# Patient Record
Sex: Male | Born: 1980 | Race: White | Hispanic: No | Marital: Single | Smoking: Current every day smoker
Health system: Southern US, Community
[De-identification: ages and names within clinical notes are randomized; demographics above are authoritative.]

## PROBLEM LIST (undated history)

## (undated) HISTORY — PX: TONSILLECTOMY: SUR1361

## (undated) HISTORY — PX: OTHER SURGICAL HISTORY: SHX169

---

## 2012-11-12 ENCOUNTER — Encounter (HOSPITAL_COMMUNITY): Payer: Self-pay | Admitting: Emergency Medicine

## 2012-11-12 ENCOUNTER — Emergency Department (INDEPENDENT_AMBULATORY_CARE_PROVIDER_SITE_OTHER)
Admission: EM | Admit: 2012-11-12 | Discharge: 2012-11-12 | Disposition: A | Discharge status: Home / Self Care | Payer: Self-pay | Source: Home / Self Care

## 2012-11-12 ENCOUNTER — Other Ambulatory Visit (HOSPITAL_COMMUNITY)
Admission: RE | Admit: 2012-11-12 | Discharge: 2012-11-12 | Disposition: A | Discharge status: Home / Self Care | Payer: Self-pay | Source: Ambulatory Visit | Attending: Family Medicine | Admitting: Family Medicine

## 2012-11-12 DIAGNOSIS — Z113 Encounter for screening for infections with a predominantly sexual mode of transmission: Secondary | ICD-10-CM | POA: Insufficient documentation

## 2012-11-12 DIAGNOSIS — R361 Hematospermia: Secondary | ICD-10-CM

## 2012-11-12 DIAGNOSIS — R319 Hematuria, unspecified: Secondary | ICD-10-CM

## 2012-11-12 LAB — POCT URINALYSIS DIP (DEVICE)
Bilirubin Urine: NEGATIVE
Hgb urine dipstick: NEGATIVE
Ketones, ur: NEGATIVE mg/dL
Protein, ur: NEGATIVE mg/dL
Specific Gravity, Urine: 1.015 (ref 1.005–1.030)
pH: 7 (ref 5.0–8.0)

## 2012-11-12 MED ORDER — DOXYCYCLINE HYCLATE 100 MG PO CAPS: 100.0000 mg | ORAL_CAPSULE | Freq: Two times a day (BID) | ORAL | Status: AC

## 2012-11-12 NOTE — ED Provider Notes (Signed)
CSN: [id number]     Arrival date & time 11/12/12  1117 History   First MD Initiated Contact with Patient 11/12/12 1258     Chief Complaint  Patient presents with  . Hematuria  . hemotosperm    (Consider location/radiation/quality/duration/timing/severity/associated sxs/prior Treatment) HPI Comments: 32 year old male states that he noticed blood in the urine when medics rating after intercourse. The next day after having sexual intercourse in his there was blood in the semen. This was 4 days ago. He denies frank dysuria he states there is an unusual pressure with urination. 3 days ago he had urinary urgency but this was self-limited and has not occurred since. He denies any recent prostate or other urologic procedures.   History reviewed. No pertinent past medical history. No past surgical history on file. No family history on file. History  Substance Use Topics  . Smoking status: Not on file  . Smokeless tobacco: Not on file  . Alcohol Use: Not on file    Review of Systems  Constitutional: Negative.   Respiratory: Negative.   Gastrointestinal: Negative.   Genitourinary: Negative for dysuria, urgency, frequency, flank pain, decreased urine volume, discharge, penile swelling, scrotal swelling, difficulty urinating, penile pain and testicular pain.  Neurological: Negative.   Hematological: Negative for adenopathy.    Allergies  Codeine  Home Medications   Current Outpatient Rx  Name  Route  Sig  Dispense  Refill  . doxycycline (VIBRAMYCIN) 100 MG capsule   Oral   Take 1 capsule (100 mg total) by mouth 2 (two) times daily.   20 capsule   0    BP 118/78  Pulse 68  Temp(Src) 98.3 F (36.8 C) (Oral)  Resp 16  SpO2 100% Physical Exam  Nursing note and vitals reviewed. Constitutional: He is oriented to person, place, and time. He appears well-developed and well-nourished. No distress.  Eyes: EOM are normal.  Neck: Normal range of motion. Neck supple.  Cardiovascular:  Normal rate.   Pulmonary/Chest: Effort normal. No respiratory distress.  Abdominal: Soft. He exhibits no distension.  Genitourinary:  Normal external male genitalia. Normal phallus. Palpation of the testicles reveal no tenderness. Vas deferens and epididymal apparatus are palpable and nontender. No nodules are palpated to the testicles. There is no swelling in the scrotum. No inguinal or suprapubic lymphadenopathy. No urethral drainage with manual pressure to the penis.  Musculoskeletal: He exhibits no edema and no tenderness.  Neurological: He is alert and oriented to person, place, and time. He exhibits normal muscle tone.  Skin: Skin is warm. No rash noted.  Psychiatric: He has a normal mood and affect.    ED Course  Procedures (including critical care ti she me and I an) Labs Review Labs Reviewed  POCT URINALYSIS DIP (DEVICE)  URINE CYTOLOGY ANCILLARY ONLY   Imaging Review No results found.  Results for orders placed during the hospital encounter of 11/12/12  POCT URINALYSIS DIP (DEVICE)      Result Value Range   Glucose, UA NEGATIVE  NEGATIVE mg/dL   Bilirubin Urine NEGATIVE  NEGATIVE   Ketones, ur NEGATIVE  NEGATIVE mg/dL   Specific Gravity, Urine 1.015  1.005 - 1.030   Hgb urine dipstick NEGATIVE  NEGATIVE   pH 7.0  5.0 - 8.0   Protein, ur NEGATIVE  NEGATIVE mg/dL   Urobilinogen, UA 0.2  0.0 - 1.0 mg/dL   Nitrite NEGATIVE  NEGATIVE   Leukocytes, UA NEGATIVE  NEGATIVE     MDM   1. Hematuria  2. Hematospermia    Doxy 100mg  BID x 10 d Urine ancillary test pending Reassurance and information on hematuria and hematospermia    Hayden Rasmussen, NP 11/12/12 1353

## 2012-11-12 NOTE — ED Notes (Signed)
C/o blood in urine and sperm which was noticed Thursday after patient has had sexual intercourse.   Patient says he only tried flushing his system.

## 2012-11-13 NOTE — ED Provider Notes (Signed)
Medical screening examination/treatment/procedure(s) were performed by a resident physician or non-physician practitioner and as the supervising physician I was immediately available for consultation/collaboration.  Clementeen Graham, MD    Rodolph Bong, MD 11/13/12 [phone]

## 2015-11-18 ENCOUNTER — Other Ambulatory Visit (HOSPITAL_COMMUNITY): Payer: Self-pay | Admitting: Sports Medicine

## 2015-11-18 DIAGNOSIS — M549 Dorsalgia, unspecified: Secondary | ICD-10-CM

## 2015-11-25 ENCOUNTER — Ambulatory Visit (HOSPITAL_COMMUNITY)
Admission: RE | Admit: 2015-11-25 | Discharge: 2015-11-25 | Disposition: A | Discharge status: Home / Self Care | Payer: Self-pay | Source: Ambulatory Visit | Attending: Sports Medicine | Admitting: Sports Medicine

## 2015-11-25 DIAGNOSIS — M5416 Radiculopathy, lumbar region: Secondary | ICD-10-CM | POA: Insufficient documentation

## 2015-11-25 DIAGNOSIS — M549 Dorsalgia, unspecified: Secondary | ICD-10-CM

## 2015-12-03 ENCOUNTER — Encounter: Payer: Self-pay | Admitting: Physical Therapy

## 2015-12-03 ENCOUNTER — Ambulatory Visit: Payer: Self-pay | Attending: Sports Medicine | Admitting: Physical Therapy

## 2015-12-03 DIAGNOSIS — M62830 Muscle spasm of back: Secondary | ICD-10-CM | POA: Insufficient documentation

## 2015-12-03 DIAGNOSIS — R262 Difficulty in walking, not elsewhere classified: Secondary | ICD-10-CM | POA: Insufficient documentation

## 2015-12-03 DIAGNOSIS — M5442 Lumbago with sciatica, left side: Secondary | ICD-10-CM | POA: Insufficient documentation
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
## 2015-12-08 ENCOUNTER — Ambulatory Visit: Payer: Self-pay | Admitting: Physical Therapy

## 2015-12-08 DIAGNOSIS — M62830 Muscle spasm of back: Secondary | ICD-10-CM

## 2015-12-08 DIAGNOSIS — M5442 Lumbago with sciatica, left side: Secondary | ICD-10-CM

## 2015-12-08 DIAGNOSIS — R262 Difficulty in walking, not elsewhere classified: Secondary | ICD-10-CM

## 2015-12-08 NOTE — Therapy (Signed)
[place], [place], [address] Phone: [phone]   Fax:  [phone]  Physical Therapy Treatment  Patient Details  Name: Derrick Herman MRN: [id number] Date of Birth: 29-Nov-1980 Referring Provider: Dr Berle Mull   Encounter Date: 12/08/2015      PT End of Session - 12/08/15 1156    Visit Number 2   Number of Visits 16   Date for PT Re-Evaluation 01/28/16   Authorization Type 3rd party insurance    PT Start Time 1016   PT Stop Time 1106   PT Time Calculation (min) 50 min   Activity Tolerance Patient tolerated treatment well   Behavior During Therapy [facility] for tasks assessed/performed      No past medical history on file.  No past surgical history on file.  There were no vitals filed for this visit.      Subjective Assessment - 12/08/15 1016    Subjective "Feeling about the same since evaluation, being consistent with stretching and not really noticing any relief at this point. Feeling more comfortable standing versus sitting"   Currently in Pain? Yes   Pain Score 7   took NSAID for pain this AM / excedrin for HA an hour ago.    Pain Location Back   Pain Orientation Right   Pain Descriptors / Indicators Aching;Shooting;Tightness;Sore   Pain Type Acute pain   Pain Onset More than a month ago   Pain Frequency Constant                         [facility] Adult PT Treatment/Exercise - 12/08/15 0001      Lumbar Exercises: Stretches   Active Hamstring Stretch 3 reps;30 seconds  contract/ relax with 10 sec contraction     Moist Heat Therapy   Number Minutes Moist Heat 10 Minutes   Moist Heat Location Lumbar Spine     Manual Therapy   Manual Therapy Muscle Energy Technique;Joint mobilization;Soft tissue mobilization;Myofascial release   Joint Mobilization L innominate anterior rotation grade 2-3   Soft tissue mobilization IASTM over the lumbar paraspinals   Myofascial Release  fascial stretching over L lumbar paraspinals   Muscle Energy Technique R sidelying: resisted L hip flexion with R anterior hip mob grade 2 oscillations, 6 x 10 sec hold                PT Education - 12/08/15 1155    Education provided Yes   Education Details updated HEP with proper form and rationale. anatmoy of the SIJ and effects of surrounding musculature   Person(s) Educated Patient   Methods Explanation;Verbal cues;Handout   Comprehension Verbalized understanding;Verbal cues required          PT Short Term Goals - 12/03/15 2049      PT SHORT TERM GOAL #1   Title Patient will demsotrate a good core contraction    Time 4   Period Weeks   Status New     PT SHORT TERM GOAL #2   Title Patient will increase lumbar flexion and extension by 25% without pain    Time 4   Period Weeks   Status New     PT SHORT TERM GOAL #3   Title Patient will improve gross left lower extremity strength to 4/5    Time 4   Period Weeks   Status New     PT SHORT TERM GOAL #4   Title Patient will report centralized  lower back pain with no left radiculopathy    Time 4   Period Weeks   Status New     PT SHORT TERM GOAL #5   Title Patient's PSIS level will be equal L V R    Time 4   Period Weeks   Status New           PT Long Term Goals - 12/03/15 2053      PT LONG TERM GOAL #1   Title Patient will ride in a car for 30 minutes wihout increased pain in order to perfrom daily tasks    Time 8   Period Weeks   Status New     PT LONG TERM GOAL #2   Title Patient will demsotrate full lumbar flexion in order to pick objects off the ground in order to put his shoes on without pain    Time 8   Period Weeks   Status New     PT LONG TERM GOAL #3   Title Patient will ambulate 2 miles without increased pain in order to return to hiking    Time 8   Period Weeks   Status New               Plan - 12/08/15 1156    Clinical Impression Statement Elan reports consistency  with HEP with minimal improvement in pain. performed L anterior innominate mobs which he reported decreased pain and reduced tingling in the foot. MET performed in R sidelying with improvement of mobility in standing/ walking. continued MHP post session to calm down muscle tightness.    PT Next Visit Plan DN,, continue with manual therapy to decrease spasming, continue with hip mobilization, Continue with pelvic correction, consider light core stability training. innominate mobs   PT Home Exercise Plan Pirifromis stretch with towel;;  single knee to chest, straight leg raise, hamstring stretching   Consulted and Agree with Plan of Care Patient      Patient will benefit from skilled therapeutic intervention in order to improve the following deficits and impairments:  Abnormal gait, Decreased activity tolerance, Decreased strength, Decreased mobility, Impaired flexibility, Increased muscle spasms, Increased fascial restricitons, Difficulty walking, Decreased range of motion, Pain, Postural dysfunction  Visit Diagnosis: Acute left-sided low back pain with left-sided sciatica  Muscle spasm of back  Difficulty in walking, not elsewhere classified     Problem List There are no active problems to display for this patient.  Starr Lake PT, DPT, LAT, ATC  12/08/15  12:08 PM      [facility] [address] [place], [place], [address] Phone: [phone]   Fax:  [phone]  Name: Derrick [facility] MRN: [id number] Date of Birth: [birth year]

## 2015-12-10 ENCOUNTER — Ambulatory Visit: Payer: Self-pay | Admitting: Physical Therapy

## 2015-12-10 DIAGNOSIS — M5442 Lumbago with sciatica, left side: Secondary | ICD-10-CM

## 2015-12-10 DIAGNOSIS — M62830 Muscle spasm of back: Secondary | ICD-10-CM

## 2015-12-10 DIAGNOSIS — R262 Difficulty in walking, not elsewhere classified: Secondary | ICD-10-CM

## 2015-12-10 NOTE — Therapy (Signed)
[place], [place], [address] Phone: [phone]   Fax:  [phone]  Physical Therapy Treatment  Patient Details  Name: Derrick Herman MRN: [id number] Date of Birth: [birth year] Referring Provider: Dr Berle Mull   Encounter Date: 12/10/2015      PT End of Session - 12/10/15 1516    Visit Number 3   Number of Visits 16   Date for PT Re-Evaluation 01/28/16   Authorization Type 3rd party insurance    PT Start Time 1148   PT Stop Time 1240   PT Time Calculation (min) 52 min   Activity Tolerance Patient tolerated treatment well   Behavior During Therapy [facility] for tasks assessed/performed      No past medical history on file.  No past surgical history on file.  There were no vitals filed for this visit.      Subjective Assessment - 12/10/15 1154    Subjective Patient reports it is a little better at times but over all he feels very stiff when he walks. He has done the stretches and exercises. He reports they are becoming easier to do.    Pertinent History Low back injury in 2005    Limitations Standing;Sitting;House hold activities   How long can you sit comfortably? < 10 min before he has to atart shifting    How long can you stand comfortably? hurts less then sitting    How long can you walk comfortably? about a 1/4 mile    Diagnostic tests MRI: (-)    Patient Stated Goals Less back pain    Currently in Pain? Yes   Pain Score 7    Pain Location Back   Pain Orientation Right   Pain Descriptors / Indicators Aching;Shooting;Tightness;Sore   Pain Type Acute pain   Pain Onset More than a month ago   Pain Frequency Constant   Aggravating Factors  lying flat, prolongged sitting, standing, walking    Pain Relieving Factors Stretches have helped some    Effect of Pain on Daily Activities difficulty perfroming ADL's/ IADL's    Multiple Pain Sites No                         [facility]  Adult PT Treatment/Exercise - 12/10/15 0001      Ambulation/Gait   Gait Comments Improved hip rotation but still limited.      Lumbar Exercises: Stretches   Active Hamstring Stretch 3 reps;30 seconds  contract/ relax with 10 sec contraction   Active Hamstring Stretch Limitations seated    Single Knee to Chest Stretch Limitations 3x15 second hold with towel    Piriformis Stretch Limitations with a towel to improve hip mobility 2x10 sec hold      Moist Heat Therapy   Number Minutes Moist Heat 10 Minutes   Moist Heat Location Lumbar Spine     Manual Therapy   Manual Therapy Muscle Energy Technique;Joint mobilization;Soft tissue mobilization;Myofascial release   Joint Mobilization L innominate anterior rotation grade 2-3   Soft tissue mobilization IASTM over the lumbar paraspinals   Myofascial Release fascial stretching over L lumbar paraspinals   Muscle Energy Technique R sidelying: resisted L hip flexion with R anterior hip mob grade 2 oscillations, 6 x 10 sec hold                PT Education - 12/10/15 1434    Education provided Yes   Education Details importance of plevic  correction; improtance of hamstring stretching, core exercises    Person(s) Educated Patient   Methods Explanation;Tactile cues;Handout   Comprehension Verbalized understanding;Verbal cues required;Returned demonstration          PT Short Term Goals - 12/10/15 1526      PT SHORT TERM GOAL #1   Title Patient will demsotrate a good core contraction    Time 4   Period Weeks   Status On-going     PT SHORT TERM GOAL #2   Title Patient will increase lumbar flexion and extension by 25% without pain    Time 4   Period Weeks   Status On-going     PT SHORT TERM GOAL #3   Title Patient will improve gross left lower extremity strength to 4/5    Time 4   Period Weeks   Status On-going     PT SHORT TERM GOAL #4   Title Patient will report centralized lower back pain with no left radiculopathy     Time 4   Period Weeks   Status On-going     PT SHORT TERM GOAL #5   Title Patient's PSIS level will be equal L V R    Period Weeks   Status On-going           PT Long Term Goals - 12/03/15 2053      PT LONG TERM GOAL #1   Title Patient will ride in a car for 30 minutes wihout increased pain in order to perfrom daily tasks    Time 8   Period Weeks   Status New     PT LONG TERM GOAL #2   Title Patient will demsotrate full lumbar flexion in order to pick objects off the ground in order to put his shoes on without pain    Time 8   Period Weeks   Status New     PT LONG TERM GOAL #3   Title Patient will ambulate 2 miles without increased pain in order to return to hiking    Time 8   Period Weeks   Status New               Plan - 12/10/15 1518    Clinical Impression Statement Patient continues to have pain with all activity but therapy was able to add in light core stregthening exercises. Therapy continued to work on right hip mobilization and MET in right side lying. He tolerated soft tissue work better as the session continued.    Rehab Potential Good   PT Frequency 2x / week   PT Duration 8 weeks   PT Treatment/Interventions ADLs/Self Care Home Management;Cryotherapy;Electrical Stimulation;Stair training;Gait training;Ultrasound;Moist Heat;Iontophoresis 49m/ml Dexamethasone;Dry needling;Passive range of motion;Therapeutic activities;Therapeutic exercise;Neuromuscular re-education;Taping;Splinting;Visual/perceptual remediation/compensation   PT Next Visit Plan DN,, continue with manual therapy to decrease spasming, continue with hip mobilization, Continue with pelvic correction, consider light core stability training. innominate mobs   PT Home Exercise Plan Pirifromis stretch with towel;;  single knee to chest, straight leg raise, hamstring stretching   Consulted and Agree with Plan of Care Patient      Patient will benefit from skilled therapeutic intervention in  order to improve the following deficits and impairments:  Abnormal gait, Decreased activity tolerance, Decreased strength, Decreased mobility, Impaired flexibility, Increased muscle spasms, Increased fascial restricitons, Difficulty walking, Decreased range of motion, Pain, Postural dysfunction  Visit Diagnosis: Acute left-sided low back pain with left-sided sciatica  Muscle spasm of back  Difficulty in walking, not elsewhere classified  Problem List There are no active problems to display for this patient.   Carney Living PT DPT  12/10/2015, 3:29 PM  [facility] [address] [place], [place], [address] Phone: [phone]   Fax:  [phone]  Name: Derrick Herman MRN: [id number] Date of Birth: February 20, 1980

## 2015-12-15 ENCOUNTER — Ambulatory Visit: Payer: Self-pay | Admitting: Physical Therapy

## 2015-12-15 DIAGNOSIS — R262 Difficulty in walking, not elsewhere classified: Secondary | ICD-10-CM

## 2015-12-15 DIAGNOSIS — M5442 Lumbago with sciatica, left side: Secondary | ICD-10-CM

## 2015-12-15 DIAGNOSIS — M62830 Muscle spasm of back: Secondary | ICD-10-CM

## 2015-12-15 NOTE — Therapy (Signed)
[place], [place], [address] Phone: [phone]   Fax:  [phone]  Physical Therapy Treatment  Patient Details  Name: Derrick Herman MRN: [id number] Date of Birth: [birth year] Referring Provider: Dr Berle Mull   Encounter Date: 12/15/2015      PT End of Session - 12/15/15 1037    Visit Number 4   Number of Visits 16   Date for PT Re-Evaluation 01/28/16   PT Start Time 0846   PT Stop Time 0934   PT Time Calculation (min) 48 min   Activity Tolerance Patient tolerated treatment well   Behavior During Therapy [facility] for tasks assessed/performed      No past medical history on file.  No past surgical history on file.  There were no vitals filed for this visit.      Subjective Assessment - 12/15/15 0850    Subjective "I woke up in alot of pain but stretching really helps it alot"    Currently in Pain? Yes   Pain Score 5    Pain Location Back   Pain Orientation Right   Pain Descriptors / Indicators Aching;Shooting;Sore   Pain Type Acute pain   Pain Onset More than a month ago   Pain Frequency Constant                         [facility] Adult PT Treatment/Exercise - 12/15/15 0854      Lumbar Exercises: Stretches   Lower Trunk Rotation 30 seconds;4 reps  2 sets with contract / relax stretching L paraspains     Knee/Hip Exercises: Aerobic   Nustep L 3 x 5 min  LE only     Knee/Hip Exercises: Supine   Other Supine Knee/Hip Exercises supine marching, keepin core tight 2 x 15  verbal cues for form     Modalities   Modalities Ultrasound     Ultrasound   Ultrasound Location L PSIS   Ultrasound Parameters 1 MHz, 1.0 w/cm2 x 6 min   Ultrasound Goals Pain     Manual Therapy   Joint Mobilization L innominate anterior rotation grade 2-3   Soft tissue mobilization IASTM over the lumbar paraspinals   Myofascial Release DTM over hamstrings on the L   using therabar   Muscle Energy  Technique R sidelying: resisted L hip flexion with R anterior hip mob grade 2 oscillations, 6 x 10 sec hold                  PT Short Term Goals - 12/10/15 1526      PT SHORT TERM GOAL #1   Title Patient will demsotrate a good core contraction    Time 4   Period Weeks   Status On-going     PT SHORT TERM GOAL #2   Title Patient will increase lumbar flexion and extension by 25% without pain    Time 4   Period Weeks   Status On-going     PT SHORT TERM GOAL #3   Title Patient will improve gross left lower extremity strength to 4/5    Time 4   Period Weeks   Status On-going     PT SHORT TERM GOAL #4   Title Patient will report centralized lower back pain with no left radiculopathy    Time 4   Period Weeks   Status On-going     PT SHORT TERM GOAL #5   Title Patient's PSIS level  will be equal L V R    Period Weeks   Status On-going           PT Long Term Goals - 12/03/15 2053      PT LONG TERM GOAL #1   Title Patient will ride in a car for 30 minutes wihout increased pain in order to perfrom daily tasks    Time 8   Period Weeks   Status New     PT LONG TERM GOAL #2   Title Patient will demsotrate full lumbar flexion in order to pick objects off the ground in order to put his shoes on without pain    Time 8   Period Weeks   Status New     PT LONG TERM GOAL #3   Title Patient will ambulate 2 miles without increased pain in order to return to hiking    Time 8   Period Weeks   Status New               Plan - 12/15/15 1033    Clinical Impression Statement pt continues to report pain with some improvement since last session. continued MET and innominate mobs. trialed [place] on the L PSIS which he reported relief of pain. pt opted not to do the DN so focused on IASTM and DTM over the parapsinals and L hamstring. post session he reported pain dropped to 3-4/10.    PT Next Visit Plan assess response to [place], SI belt? Continue with manual therapy to decrease  spasming, continue with hip mobilization, Continue with pelvic correction, consider light core stability training. innominate mobs   Consulted and Agree with Plan of Care Patient      Patient will benefit from skilled therapeutic intervention in order to improve the following deficits and impairments:  Abnormal gait, Decreased activity tolerance, Decreased strength, Decreased mobility, Impaired flexibility, Increased muscle spasms, Increased fascial restricitons, Difficulty walking, Decreased range of motion, Pain, Postural dysfunction  Visit Diagnosis: Acute left-sided low back pain with left-sided sciatica  Muscle spasm of back  Difficulty in walking, not elsewhere classified     Problem List There are no active problems to display for this patient.  Starr Lake PT, DPT, LAT, ATC  12/15/15  10:43 AM      [facility] [address] [place], [place], [address] Phone: [phone]   Fax:  [phone]  Name: Derrick Herman MRN: [id number] Date of Birth: 04/13/1980

## 2015-12-22 ENCOUNTER — Ambulatory Visit: Payer: Self-pay | Admitting: Physical Therapy

## 2015-12-22 DIAGNOSIS — M5442 Lumbago with sciatica, left side: Secondary | ICD-10-CM

## 2015-12-22 DIAGNOSIS — M62830 Muscle spasm of back: Secondary | ICD-10-CM

## 2015-12-22 DIAGNOSIS — R262 Difficulty in walking, not elsewhere classified: Secondary | ICD-10-CM

## 2015-12-23 NOTE — Therapy (Signed)
[facility] [address] [place], [place], [address] Phone: [phone]   Fax:  [phone]  Physical Therapy Treatment  Patient Details  Name: Derrick Herman MRN: [id number] Date of Birth: 09/04/1980 Referring Provider: Dr Pati GalloJames Kramer   Encounter Date: 12/22/2015      PT End of Session - 12/22/15 0851    Visit Number 5   Number of Visits 16   Date for PT Re-Evaluation 01/28/16   Authorization Type 3rd party insurance    PT Start Time 0845   PT Stop Time 0945   PT Time Calculation (min) 60 min      No past medical history on file.  No past surgical history on file.  There were no vitals filed for this visit.      Subjective Assessment - 12/22/15 0851    Subjective The mornings are always rough. My girlfriend pulls on my leg for me. That helps alot.    Currently in Pain? Yes   Pain Score 6    Pain Location Back   Pain Orientation Left   Aggravating Factors  sitting   Pain Relieving Factors lying down                          [facility] Adult PT Treatment/Exercise - 12/23/15 0001      Lumbar Exercises: Stretches   Passive Hamstring Stretch 1 rep;60 seconds   Lower Trunk Rotation Limitations to the right with left foot on knee for posterior capsule stretch then modified fidure four 3x 30 sec   Pelvic Tilt 10 seconds;5 reps   Piriformis Stretch 30 seconds;3 reps     Knee/Hip Exercises: Aerobic   Nustep L2 UE/LE x 6 minutes      Knee/Hip Exercises: Supine   Other Supine Knee/Hip Exercises pelvic tilt with isometric hip abduction 5 sec x 10, ball squeeze with pelvic tilt x 10      Moist Heat Therapy   Moist Heat Location Lumbar Spine  prone     Manual Therapy   Joint Mobilization Long axis distraction multiple times throughout treatment, A/P hip mobs grade 3, improved left hip PROM with less pain afterward                    PT Short Term Goals - 12/22/15 [id number]      PT SHORT TERM  GOAL #1   Title Patient will demsotrate a good core contraction    Time 4   Period Weeks   Status On-going     PT SHORT TERM GOAL #2   Title Patient will increase lumbar flexion and extension by 25% without pain    Status On-going     PT SHORT TERM GOAL #3   Title Patient will improve gross left lower extremity strength to 4/5    Status On-going     PT SHORT TERM GOAL #4   Title Patient will report centralized lower back pain with no left radiculopathy    Baseline radiculopathy is intermittent now, multiple times per day    Time 4   Period Weeks   Status On-going     PT SHORT TERM GOAL #5   Title Patient's PSIS level will be equal L V R    Time 4   Period Weeks   Status On-going           PT Long Term Goals - 12/03/15 2053      PT LONG  TERM GOAL #1   Title Patient will ride in a car for 30 minutes wihout increased pain in order to perfrom daily tasks    Time 8   Period Weeks   Status New     PT LONG TERM GOAL #2   Title Patient will demsotrate full lumbar flexion in order to pick objects off the ground in order to put his shoes on without pain    Time 8   Period Weeks   Status New     PT LONG TERM GOAL #3   Title Patient will ambulate 2 miles without increased pain in order to return to hiking    Time 8   Period Weeks   Status New               Plan - 12/22/15 [id number]    Clinical Impression Statement Pt reports left lumbar, left hip pain and left foot N/T. He demonstrates decreased left hip PROM with increased pain with all motions. After hip distraction and mobilization, PROM improved significantly with less pain. Performed stabilization exercises with cues to perfrom gently and keep comfortable. He reports deep hip pain with therex. He reports feeling looser after treatment however no change in pain. HMP at end of treatment in prone position. He reports prone lying helps ease his pain.     PT Next Visit Plan assess response to US, SI belt? Continue with  manual therapy to decrease spasming, continue with hip mobilization, Continue with pelvic correction, consider light core stability training. innominate mobs   PT Home Exercise Plan Pirifromis stretch with towel;;  single knee to chest, straight leg raise, hamstring stretching   Consulted and Agree with Plan of Care Patient      Patient will benefit from skilled therapeutic intervention in order to improve the following deficits and impairments:  Abnormal gait, Decreased activity tolerance, Decreased strength, Decreased mobility, Impaired flexibility, Increased muscle spasms, Increased fascial restricitons, Difficulty walking, Decreased range of motion, Pain, Postural dysfunction  Visit Diagnosis: Muscle spasm of back  Difficulty in walking, not elsewhere classified  Acute left-sided low back pain with left-sided sciatica     Problem List There are no active problems to display for this patient.   Sherrie MustacheDonoho, [name], [place]PTA 12/23/2015, 2:55 PM  [facility] [address] [place], [place], [address] Phone: [phone]   Fax:  [phone]  Name: Derrick Herman MRN: [id number] Date of Birth: 12/24/1980

## 2015-12-24 ENCOUNTER — Ambulatory Visit: Payer: Self-pay | Admitting: Physical Therapy

## 2015-12-24 DIAGNOSIS — M62830 Muscle spasm of back: Secondary | ICD-10-CM

## 2015-12-24 DIAGNOSIS — R262 Difficulty in walking, not elsewhere classified: Secondary | ICD-10-CM

## 2015-12-24 DIAGNOSIS — M5442 Lumbago with sciatica, left side: Secondary | ICD-10-CM

## 2015-12-24 NOTE — Therapy (Signed)
[facility] [address] [place], [place], [address] Phone: [phone]   Fax:  [phone]  Physical Therapy Treatment  Patient Details  Name: Derrick Herman MRN: [id number] Date of Birth: 05/25/1980 Referring Provider: Dr Pati GalloJames Kramer   Encounter Date: 12/24/2015      PT End of Session - 12/24/15 2054    Visit Number 6   Number of Visits 16   Date for PT Re-Evaluation 01/28/16   Authorization Type 3rd party insurance    PT Start Time 1100   PT Stop Time 1142   PT Time Calculation (min) 42 min   Activity Tolerance Patient tolerated treatment well   Behavior During Therapy [facility] for tasks assessed/performed      No past medical history on file.  No past surgical history on file.  There were no vitals filed for this visit.      Subjective Assessment - 12/24/15 2047    Subjective Patient reports he has been able to get in and out of the shower better. He continues to have pain but he reports it is intermittnet.    Pertinent History Low back injury in 2005    Limitations Standing;Sitting;House hold activities   How long can you sit comfortably? < 10 min before he has to atart shifting    How long can you stand comfortably? hurts less then sitting    How long can you walk comfortably? about a 1/4 mile    Diagnostic tests MRI: (-)    Patient Stated Goals Less back pain    Currently in Pain? Yes   Pain Score 6    Pain Location Back   Pain Orientation Left   Pain Descriptors / Indicators Aching;Shooting;Sore   Pain Type Acute pain   Pain Onset More than a month ago   Pain Frequency Constant   Aggravating Factors  sitting    Pain Relieving Factors lying down    Effect of Pain on Daily Activities difficulty perfroming ADL's                          [facility] Adult PT Treatment/Exercise - 12/24/15 0001      Lumbar Exercises: Stretches   Passive Hamstring Stretch 1 rep;60 seconds   Lower Trunk Rotation  Limitations to the right with left foot on knee for posterior capsule stretch then modified fidure four 3x 30 sec   Pelvic Tilt 10 seconds;5 reps   Piriformis Stretch 30 seconds;3 reps     Knee/Hip Exercises: Aerobic   Nustep L2 UE/LE x 6 minutes      Manual Therapy   Joint Mobilization L innominate anterior rotation grade 2-3   Soft tissue mobilization IASTM over the lumbar paraspinals   Myofascial Release --  using therabar   Muscle Energy Technique R sidelying: resisted L hip flexion with R anterior hip mob grade 2 oscillations, 6 x 10 sec hold                PT Education - 12/24/15 2054    Education provided Yes   Education Details continue with stretching at home    Person(s) Educated Patient   Methods Explanation;Tactile cues;Handout   Comprehension Verbalized understanding;Returned demonstration;Verbal cues required;Tactile cues required          PT Short Term Goals - 12/22/15 0852      PT SHORT TERM GOAL #1   Title Patient will demsotrate a good core contraction    Time  4   Period Weeks   Status On-going     PT SHORT TERM GOAL #2   Title Patient will increase lumbar flexion and extension by 25% without pain    Status On-going     PT SHORT TERM GOAL #3   Title Patient will improve gross left lower extremity strength to 4/5    Status On-going     PT SHORT TERM GOAL #4   Title Patient will report centralized lower back pain with no left radiculopathy    Baseline radiculopathy is intermittent now, multiple times per day    Time 4   Period Weeks   Status On-going     PT SHORT TERM GOAL #5   Title Patient's PSIS level will be equal L V R    Time 4   Period Weeks   Status On-going           PT Long Term Goals - 12/03/15 2053      PT LONG TERM GOAL #1   Title Patient will ride in a car for 30 minutes wihout increased pain in order to perfrom daily tasks    Time 8   Period Weeks   Status New     PT LONG TERM GOAL #2   Title Patient will  demsotrate full lumbar flexion in order to pick objects off the ground in order to put his shoes on without pain    Time 8   Period Weeks   Status New     PT LONG TERM GOAL #3   Title Patient will ambulate 2 miles without increased pain in order to return to hiking    Time 8   Period Weeks   Status New               Plan - 12/24/15 2056    Clinical Impression Statement He had improved carryover with leg movement today. He continues to have spasming in the lumbar spine but it is improving. Next visit add in light core strethening and conside press-ups.    Rehab Potential Good   PT Frequency 2x / week   PT Duration 8 weeks   PT Treatment/Interventions ADLs/Self Care Home Management;Cryotherapy;Electrical Stimulation;Stair training;Gait training;Ultrasound;Moist Heat;Iontophoresis 4mg /ml Dexamethasone;Dry needling;Passive range of motion;Therapeutic activities;Therapeutic exercise;Neuromuscular re-education;Taping;Splinting;Visual/perceptual remediation/compensation   PT Next Visit Plan assess response to US, SI belt? Continue with manual therapy to decrease spasming, continue with hip mobilization, Continue with pelvic correction, consider light core stability training. innominate mobs   PT Home Exercise Plan Pirifromis stretch with towel;;  single knee to chest, straight leg raise, hamstring stretching   Consulted and Agree with Plan of Care Patient      Patient will benefit from skilled therapeutic intervention in order to improve the following deficits and impairments:  Abnormal gait, Decreased activity tolerance, Decreased strength, Decreased mobility, Impaired flexibility, Increased muscle spasms, Increased fascial restricitons, Difficulty walking, Decreased range of motion, Pain, Postural dysfunction  Visit Diagnosis: Muscle spasm of back  Difficulty in walking, not elsewhere classified  Acute left-sided low back pain with left-sided sciatica     Problem List There  are no active problems to display for this patient.   Dessie Comaavid J Roselie Cirigliano PT DPT  12/24/2015, 8:59 PM  [facility] [address] [place], [place], [address] Phone: [phone]   Fax:  [phone]  Name: Derrick SectionKenneth Kagawa MRN: [id number] Date of Birth: 02/23/1980

## 2015-12-25 ENCOUNTER — Encounter (HOSPITAL_COMMUNITY): Payer: Self-pay | Admitting: *Deleted

## 2015-12-25 ENCOUNTER — Emergency Department (HOSPITAL_COMMUNITY)
Admission: EM | Admit: 2015-12-25 | Discharge: 2015-12-26 | Disposition: A | Discharge status: Home / Self Care | Payer: No Typology Code available for payment source | Attending: Emergency Medicine | Admitting: Emergency Medicine

## 2015-12-25 ENCOUNTER — Emergency Department (HOSPITAL_COMMUNITY): Payer: No Typology Code available for payment source

## 2015-12-25 DIAGNOSIS — Y92410 Unspecified street and highway as the place of occurrence of the external cause: Secondary | ICD-10-CM | POA: Insufficient documentation

## 2015-12-25 DIAGNOSIS — M545 Low back pain: Secondary | ICD-10-CM | POA: Diagnosis not present

## 2015-12-25 DIAGNOSIS — R0781 Pleurodynia: Secondary | ICD-10-CM | POA: Insufficient documentation

## 2015-12-25 DIAGNOSIS — F17200 Nicotine dependence, unspecified, uncomplicated: Secondary | ICD-10-CM | POA: Insufficient documentation

## 2015-12-25 DIAGNOSIS — S80812A Abrasion, left lower leg, initial encounter: Secondary | ICD-10-CM | POA: Diagnosis not present

## 2015-12-25 DIAGNOSIS — Y939 Activity, unspecified: Secondary | ICD-10-CM | POA: Insufficient documentation

## 2015-12-25 DIAGNOSIS — M542 Cervicalgia: Secondary | ICD-10-CM | POA: Insufficient documentation

## 2015-12-25 DIAGNOSIS — S0990XA Unspecified injury of head, initial encounter: Secondary | ICD-10-CM | POA: Diagnosis present

## 2015-12-25 DIAGNOSIS — S0093XA Contusion of unspecified part of head, initial encounter: Secondary | ICD-10-CM | POA: Insufficient documentation

## 2015-12-25 DIAGNOSIS — S0081XA Abrasion of other part of head, initial encounter: Secondary | ICD-10-CM | POA: Insufficient documentation

## 2015-12-25 DIAGNOSIS — S80811A Abrasion, right lower leg, initial encounter: Secondary | ICD-10-CM | POA: Insufficient documentation

## 2015-12-25 DIAGNOSIS — Y999 Unspecified external cause status: Secondary | ICD-10-CM | POA: Diagnosis not present
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
## 2015-12-25 NOTE — ED Notes (Signed)
Patient transported to XRAY 
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
## 2015-12-25 NOTE — ED Notes (Signed)
Patient is A&Ox4 at this time.  Patient in no signs of distress.  Please see providers note for complete history and physical exam.  

## 2015-12-26 ENCOUNTER — Emergency Department (HOSPITAL_COMMUNITY): Payer: No Typology Code available for payment source

## 2015-12-26 MED ORDER — IBUPROFEN 400 MG PO TABS
ORAL_TABLET | ORAL | Status: AC
  Filled 2015-12-26: qty 2

## 2015-12-26 MED ORDER — IBUPROFEN 400 MG PO TABS
800.0000 mg | ORAL_TABLET | Freq: Once | ORAL | Status: AC
  Administered 2015-12-26: 800 mg via ORAL

## 2015-12-26 MED ORDER — CYCLOBENZAPRINE HCL 10 MG PO TABS: 10.0000 mg | ORAL_TABLET | Freq: Two times a day (BID) | ORAL | 0 refills | Status: AC | PRN

## 2015-12-26 MED ORDER — IBUPROFEN 600 MG PO TABS: 600.0000 mg | ORAL_TABLET | Freq: Four times a day (QID) | ORAL | 0 refills | Status: AC | PRN

## 2015-12-26 MED ORDER — CYCLOBENZAPRINE HCL 10 MG PO TABS
10.0000 mg | ORAL_TABLET | Freq: Once | ORAL | Status: AC
  Administered 2015-12-26: 10 mg via ORAL

## 2015-12-26 MED ORDER — OXYCODONE-ACETAMINOPHEN 5-325 MG PO TABS
1.0000 | ORAL_TABLET | Freq: Once | ORAL | Status: DC
Start: 2015-12-26 — End: 2015-12-26

## 2015-12-26 MED ORDER — CYCLOBENZAPRINE HCL 10 MG PO TABS
ORAL_TABLET | ORAL | Status: AC
  Filled 2015-12-26: qty 1

## 2015-12-26 MED ORDER — OXYCODONE-ACETAMINOPHEN 5-325 MG PO TABS
ORAL_TABLET | ORAL | Status: AC
  Filled 2015-12-26: qty 1

## 2015-12-26 NOTE — ED Notes (Signed)
Patient Alert and oriented X4. Stable and ambulatory. Patient verbalized understanding of the discharge instructions.  Patient belongings were taken by the patient.  

## 2015-12-26 NOTE — ED Notes (Signed)
Patient being taken to X RAY

## 2015-12-26 NOTE — ED Notes (Signed)
Patient returned from XRAY 
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
## 2015-12-29 ENCOUNTER — Ambulatory Visit: Payer: Self-pay | Admitting: Physical Therapy

## 2015-12-31 ENCOUNTER — Emergency Department: Payer: No Typology Code available for payment source

## 2015-12-31 ENCOUNTER — Emergency Department
Admission: EM | Admit: 2015-12-31 | Discharge: 2015-12-31 | Disposition: A | Discharge status: Home / Self Care | Payer: No Typology Code available for payment source | Attending: Emergency Medicine | Admitting: Emergency Medicine

## 2015-12-31 ENCOUNTER — Encounter: Payer: Self-pay | Admitting: Emergency Medicine

## 2015-12-31 ENCOUNTER — Ambulatory Visit: Payer: Self-pay | Admitting: Physical Therapy

## 2015-12-31 DIAGNOSIS — S80812A Abrasion, left lower leg, initial encounter: Secondary | ICD-10-CM

## 2015-12-31 DIAGNOSIS — F17200 Nicotine dependence, unspecified, uncomplicated: Secondary | ICD-10-CM | POA: Diagnosis not present

## 2015-12-31 DIAGNOSIS — M25772 Osteophyte, left ankle: Secondary | ICD-10-CM | POA: Insufficient documentation

## 2015-12-31 DIAGNOSIS — S8012XA Contusion of left lower leg, initial encounter: Secondary | ICD-10-CM | POA: Diagnosis not present

## 2015-12-31 DIAGNOSIS — Y9389 Activity, other specified: Secondary | ICD-10-CM | POA: Insufficient documentation

## 2015-12-31 DIAGNOSIS — Z791 Long term (current) use of non-steroidal anti-inflammatories (NSAID): Secondary | ICD-10-CM | POA: Insufficient documentation

## 2015-12-31 DIAGNOSIS — Y92411 Interstate highway as the place of occurrence of the external cause: Secondary | ICD-10-CM | POA: Diagnosis not present

## 2015-12-31 DIAGNOSIS — S8992XA Unspecified injury of left lower leg, initial encounter: Secondary | ICD-10-CM | POA: Diagnosis present

## 2015-12-31 DIAGNOSIS — Y999 Unspecified external cause status: Secondary | ICD-10-CM | POA: Insufficient documentation

## 2015-12-31 MED ORDER — NAPROXEN 500 MG PO TABS: 500.0000 mg | ORAL_TABLET | Freq: Two times a day (BID) | ORAL | 0 refills | Status: AC
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
## 2016-01-04 ENCOUNTER — Ambulatory Visit (INDEPENDENT_AMBULATORY_CARE_PROVIDER_SITE_OTHER): Payer: Self-pay | Admitting: Orthopaedic Surgery

## 2016-01-04 ENCOUNTER — Encounter (INDEPENDENT_AMBULATORY_CARE_PROVIDER_SITE_OTHER): Payer: Self-pay | Admitting: Orthopaedic Surgery

## 2016-01-04 ENCOUNTER — Encounter: Payer: Self-pay | Admitting: Physical Therapy

## 2016-01-04 ENCOUNTER — Ambulatory Visit: Payer: Self-pay | Admitting: Physical Therapy

## 2016-01-04 DIAGNOSIS — M79605 Pain in left leg: Secondary | ICD-10-CM

## 2016-01-04 DIAGNOSIS — M545 Low back pain, unspecified: Secondary | ICD-10-CM

## 2016-01-04 MED ORDER — METHOCARBAMOL 500 MG PO TABS: 500.0000 mg | ORAL_TABLET | Freq: Four times a day (QID) | ORAL | 2 refills | Status: AC | PRN

## 2016-01-04 MED ORDER — IBUPROFEN-FAMOTIDINE 800-26.6 MG PO TABS: 1.0000 | ORAL_TABLET | Freq: Three times a day (TID) | ORAL | 0 refills | Status: AC | PRN
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
## 2016-01-21 ENCOUNTER — Ambulatory Visit: Payer: Self-pay | Attending: Orthopaedic Surgery

## 2016-01-21 DIAGNOSIS — R262 Difficulty in walking, not elsewhere classified: Secondary | ICD-10-CM

## 2016-01-21 DIAGNOSIS — M25612 Stiffness of left shoulder, not elsewhere classified: Secondary | ICD-10-CM

## 2016-01-21 DIAGNOSIS — R293 Abnormal posture: Secondary | ICD-10-CM

## 2016-01-21 DIAGNOSIS — M62830 Muscle spasm of back: Secondary | ICD-10-CM

## 2016-01-21 DIAGNOSIS — M25562 Pain in left knee: Secondary | ICD-10-CM

## 2016-01-21 DIAGNOSIS — M436 Torticollis: Secondary | ICD-10-CM
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
## 2016-01-27 ENCOUNTER — Ambulatory Visit: Payer: Self-pay

## 2016-01-28 ENCOUNTER — Ambulatory Visit: Payer: Self-pay | Admitting: Physical Therapy

## 2016-02-01 ENCOUNTER — Ambulatory Visit: Payer: Self-pay | Attending: Orthopaedic Surgery

## 2016-02-01 DIAGNOSIS — M25612 Stiffness of left shoulder, not elsewhere classified: Secondary | ICD-10-CM | POA: Insufficient documentation

## 2016-02-01 DIAGNOSIS — M436 Torticollis: Secondary | ICD-10-CM | POA: Insufficient documentation

## 2016-02-01 DIAGNOSIS — M25562 Pain in left knee: Secondary | ICD-10-CM | POA: Insufficient documentation

## 2016-02-01 DIAGNOSIS — M62830 Muscle spasm of back: Secondary | ICD-10-CM | POA: Insufficient documentation

## 2016-02-01 DIAGNOSIS — M5442 Lumbago with sciatica, left side: Secondary | ICD-10-CM | POA: Insufficient documentation

## 2016-02-01 DIAGNOSIS — R262 Difficulty in walking, not elsewhere classified: Secondary | ICD-10-CM | POA: Insufficient documentation

## 2016-02-01 DIAGNOSIS — R293 Abnormal posture: Secondary | ICD-10-CM | POA: Insufficient documentation

## 2016-02-01 NOTE — Therapy (Signed)
[facility] [address] [place], [place], [address] Phone: [phone]   Fax:  [phone]  Physical Therapy Treatment  Patient Details  Name: Derrick Herman MRN: [id number] Date of Birth: [birth year] Referring Provider: Jacklynn Barnacle, MD  Encounter Date: 02/01/2016      PT End of Session - 02/01/16 1539    Visit Number 2   Number of Visits 16   Date for PT Re-Evaluation 03/18/16   Authorization Type 3rd party insurance    PT Start Time [id number]   PT Stop Time 0440   PT Time Calculation (min) 60 min   Activity Tolerance Patient tolerated treatment well;Patient limited by pain   Behavior During Therapy [facility] for tasks assessed/performed      No past medical history on file.  Past Surgical History:  Procedure Laterality Date  . TONSILLECTOMY    . 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      Subjective Assessment - 02/01/16 1544    Subjective Walking better. easier to turn head but pain with flex and extension  and pain with scapula retraction.    Currently in Pain? Yes   Pain Score 4   NWB   Pain Location Knee   Pain Orientation Left;Anterior;Medial   Pain Descriptors / Indicators Aching;Sharp   Pain Type Acute pain   Pain Onset More than a month ago   Pain Frequency Constant   Aggravating Factors  weight bearing   Pain Relieving Factors rest   Pain Score 6   Pain Location Thoracic   Pain Orientation Posterior   Pain Descriptors / Indicators Shooting   Pain Type Acute pain   Pain Onset More than a month ago   Pain Frequency Constant   Aggravating Factors  poor posture, flex/ext neck   Pain Relieving Factors slump posture                         [facility] Adult PT Treatment/Exercise - 02/01/16 0001      Neck Exercises: Supine   Cervical Rotation Right;Left;10 reps   Other Supine Exercise IR and ER elbowd at side. x12, then bench  press x 12 , isometrics shoulder extension into mat with scp retraction  (reports most painful so far)     Lumbar Exercises: Stretches   Lower Trunk Rotation Limitations 10 reps 2-3 sec      Lumbar Exercises: Supine   Bridge Limitations shor range bridges x 12   Other Supine Lumbar Exercises SLR LT leg 2x10 then holding thigh flex ext Lt knee  for flexion ROM x 15 then prone knee flexion x 12 RT and Len bench press 12 reps      Knee/Hip Exercises: Supine   Other Supine Knee/Hip Exercises posterior pelvic tilt.        Moist Heat Therapy   Number Minutes Moist Heat 18 Minutes   Moist Heat Location Cervical  thoracic     Electrical Stimulation   Electrical Stimulation Location neck and upper back   Electrical Stimulation Action IFC   Electrical Stimulation Parameters L12   Electrical Stimulation Goals Pain     Manual Therapy   Manual Therapy Joint mobilization   Joint Mobilization Mobs with movement with PA glides with deep breathing to thoracic spine    Soft tissue mobilization IASTM over the  cervical and thoracic paraspinals  PT Short Term Goals - 01/21/16 1658      PT SHORT TERM GOAL #1   Title Pt will demo inital HEP    Time 2   Period Weeks   Status New     PT SHORT TERM GOAL #2   Title Pt will report pain in upper back decreased 30% or more   Time 4   Period Weeks   Status New     PT SHORT TERM GOAL #3   Title Pt will demo improved Lt knee flexion to 110 degrees and able to lay knee fully extended   Time 4   Period Weeks   Status New     PT SHORT TERM GOAL #4   Title Cervical motion in creased 10 degrees or more with flexion and extension   Time 4   Period Weeks   Status New           PT Long Term Goals - 01/21/16 1700      PT LONG TERM GOAL #1   Title Patient will ride in a car for 30 minutes wihout increased pain in order to perfrom daily tasks    Time 8   Period Weeks   Status New     PT LONG TERM GOAL #2   Title  Patient will demsotrate full cervical  flexion in order to pick objects off the ground in order to put his shoes on without pain    Time 8   Period Weeks   Status New     PT LONG TERM GOAL #3   Title He will be able to assume as close to normal posture without increaed pain.    Time 8   Period Weeks   Status New     PT LONG TERM GOAL #4   Title He will be able to walk 1 mile for exercises with 1-2 max pain in knee and neck/upper back   Time 8   Period Weeks   Status New     PT LONG TERM GOAL #5   Title cer vical rotation to 65 degrees or more RT and LT so able to loook behind with min to no pain.    Time 8   Period Weeks   Status New     Additional Long Term Goals   Additional Long Term Goals Yes     PT LONG TERM GOAL #6   Title General pain in upper back and neck will become intermittant and max 2-3/10    Time 8   Period Weeks   Status New               Plan - 02/01/16 1637    Clinical Impression Statement Mr Arkin appears improved with significantly decr limp and improved LT knee extension. He si tender in upper back but tolerated manual. Need to continue to push ROM active exercise   PT Treatment/Interventions ADLs/Self Care Home Management;Cryotherapy;Electrical Stimulation;Stair training;Gait training;Ultrasound;Moist Heat;Iontophoresis 4mg /ml Dexamethasone;Dry needling;Passive range of motion;Therapeutic activities;Therapeutic exercise;Neuromuscular re-education;Taping;Splinting;Visual/perceptual remediation/compensation   PT Next Visit Plan Manual for ROM /stiffness , modalities as helpful, start HEP for neck and upper back   PT Home Exercise Plan Pirifromis stretch with towel;;  single knee to chest, straight leg raise, hamstring stretching   Consulted and Agree with Plan of Care Patient      Patient will benefit from skilled therapeutic intervention in order to improve the following deficits and impairments:  Abnormal gait, Decreased activity tolerance,  Decreased strength, Decreased  mobility, Impaired flexibility, Increased muscle spasms, Increased fascial restricitons, Difficulty walking, Decreased range of motion, Pain, Postural dysfunction  Visit Diagnosis: Muscle spasm of back  Difficulty in walking, not elsewhere classified  Stiffness of left shoulder, not elsewhere classified  Stiffness of cervical spine  Abnormal posture  Acute pain of left knee  Acute left-sided low back pain with left-sided sciatica     Problem List There are no active problems to display for this patient.   Caprice RedChasse, Demaryius Imran M  PT 02/01/2016, 4:41 PM  [facility] [address] [place], [place], [address] Phone: [phone]   Fax:  [phone]  Name: Derrick Herman MRN: [id number] Date of Birth: 03/25/1980

## 2016-02-03 ENCOUNTER — Ambulatory Visit: Payer: Self-pay | Admitting: Physical Therapy

## 2016-02-03 DIAGNOSIS — M436 Torticollis: Secondary | ICD-10-CM

## 2016-02-03 DIAGNOSIS — M5442 Lumbago with sciatica, left side: Secondary | ICD-10-CM

## 2016-02-03 DIAGNOSIS — R262 Difficulty in walking, not elsewhere classified: Secondary | ICD-10-CM

## 2016-02-03 DIAGNOSIS — M25562 Pain in left knee: Secondary | ICD-10-CM

## 2016-02-03 DIAGNOSIS — R293 Abnormal posture: Secondary | ICD-10-CM

## 2016-02-03 DIAGNOSIS — M62830 Muscle spasm of back: Secondary | ICD-10-CM

## 2016-02-03 DIAGNOSIS — M25612 Stiffness of left shoulder, not elsewhere classified: Secondary | ICD-10-CM

## 2016-02-03 NOTE — Therapy (Signed)
[facility] [address] [place], [place], [address] Phone: [phone]   Fax:  [phone]  Physical Therapy Treatment  Patient Details  Name: Derrick Herman MRN: [id number] Date of Birth: 05/30/1980 Referring Provider: Jacklynn BarnacleN Xu, MD  Encounter Date: 02/03/2016      PT End of Session - 02/03/16 1756    Visit Number 3   Number of Visits 16   Date for PT Re-Evaluation 03/18/16   PT Start Time 1503   PT Stop Time 1600   PT Time Calculation (min) 57 min   Activity Tolerance Patient tolerated treatment well   Behavior [facility] Therapy [facility] for tasks assessed/performed      No past medical history on file.  Past Surgical History:  Procedure Laterality Date  . TONSILLECTOMY    . tumor removal Right    age 89, R knee, [facility]    There were no vitals filed for this visit.      Subjective Assessment - 02/03/16 1506    Subjective Turning head is getting better.  heat IFC helped pain 4-5 hours at a time.  Less pain with driving.   Currently in Pain? Yes   Pain Score 6    Pain Location Knee   Pain Orientation Left;Anterior   Pain Descriptors / Indicators Sharp   Pain Frequency Constant   Aggravating Factors  turning,  worse as the day goes on ,  weightbearing,  straightening out   Pain Relieving Factors sleeping with pillows.  elevation   Effect of Pain on Daily Activities wakes if he sleeps    Pain Location Back   Pain Orientation Upper   Pain Descriptors / Indicators Sharp;Tingling;Pins and needles   Pain Radiating Towards pinkiy   Pain Frequency Intermittent   Aggravating Factors  neck extension ,  sitting straighe   Pain Relieving Factors heat, slump posture, pillow under knees with sleeping                         [facility] Adult PT Treatment/Exercise - 02/03/16 0001      Neck Exercises: Theraband   Scapula Retraction 5 reps   Scapula Retraction Limitations Painful, cues needed, HEP   AROM, no band for this   Rows 10 reps   Rows Limitations red band cues, HEP     Shoulder Exercises: Pulleys   Flexion 3 minutes   Flexion Limitations painful      Shoulder Exercises: Stretch   Corner Stretch 3 reps;30 seconds  single felt better than double,  doorway stretch, HEP     Moist Heat Therapy   Number Minutes Moist Heat 17 Minutes   Moist Heat Location Cervical;Knee     Electrical Stimulation   Electrical Stimulation Location neck upper back   Electrical Stimulation Action IFC   Electrical Stimulation Parameters 19,  L20   Electrical Stimulation Goals Pain                PT Education - 02/03/16 1756    Education provided Yes   Education Details HEP   Person(s) Educated Patient   Methods Explanation;Demonstration;Tactile cues;Verbal cues;Handout   Comprehension Verbalized understanding;Returned demonstration          PT Short Term Goals - 02/03/16 1802      PT SHORT TERM GOAL #1   Title Pt will demo inital HEP    Baseline issued today   Time 2   Period Weeks   Status On-going  PT SHORT TERM GOAL #2   Title Pt will report pain in upper back decreased 30% or more   Baseline not yet improved   Time 4   Period Weeks   Status On-going     PT SHORT TERM GOAL #3   Title Pt will demo improved Lt knee flexion to 110 degrees and able to lay knee fully extended   Time 4   Period Weeks   Status Unable to assess           PT Long Term Goals - 01/21/16 1700      PT LONG TERM GOAL #1   Title Patient will ride in a car for 30 minutes wihout increased pain in order to perfrom daily tasks    Time 8   Period Weeks   Status New     PT LONG TERM GOAL #2   Title Patient will demsotrate full cervical  flexion in order to pick objects off the ground in order to put his shoes on without pain    Time 8   Period Weeks   Status New     PT LONG TERM GOAL #3   Title He will be able to assume as close to normal posture without increaed pain.     Time 8   Period Weeks   Status New     PT LONG TERM GOAL #4   Title He will be able to walk 1 mile for exercises with 1-2 max pain in knee and neck/upper back   Time 8   Period Weeks   Status New     PT LONG TERM GOAL #5   Title cer vical rotation to 65 degrees or more RT and LT so able to loook behind with min to no pain.    Time 8   Period Weeks   Status New     Additional Long Term Goals   Additional Long Term Goals Yes     PT LONG TERM GOAL #6   Title General pain in upper back and neck will become intermittant and max 2-3/10    Time 8   Period Weeks   Status New               Plan - 02/03/16 1757    Clinical Impression Statement Pain 7-8/10 post session.  Progress toward home exercise goal with neck and upper back exercises. .  Modsalities helpful.  Patient might consider TENS on line for use at home.  Objective finding:  Patient able to decrease thoracic curve with exercises and inprove head posture. Patient again encouraged to move actively.    PT Next Visit Plan Manual for ROM /stiffness , modalities as helpful, review HEP for neck and upper back   PT Home Exercise Plan Pirifromis stretch with towel;;  single knee to chest, straight leg raise, hamstring stretching,  ROW red band,  neck retraction,  scapular retraction.   Consulted and Agree with Plan of Care Patient      Patient will benefit from skilled therapeutic intervention in order to improve the following deficits and impairments:  Abnormal gait, Decreased activity tolerance, Decreased strength, Decreased mobility, Impaired flexibility, Increased muscle spasms, Increased fascial restricitons, Difficulty walking, Decreased range of motion, Pain, Postural dysfunction  Visit Diagnosis: Muscle spasm of back  Difficulty in walking, not elsewhere classified  Stiffness of left shoulder, not elsewhere classified  Stiffness of cervical spine  Abnormal posture  Acute pain of left knee  Acute left-sided  low  back pain with left-sided sciatica     Problem List There are no active problems to display for this patient.   [name],[name] PTA 02/03/2016, 6:03 PM  [facility] [address] [place], [place], [address] Phone: [phone]   Fax:  [phone]  Name: Derrick Herman MRN: [id number] Date of Birth: [birth year]

## 2016-02-03 NOTE — Patient Instructions (Signed)
From ex drawer.  Do 2 x a day or more if helpful.: Retraction neck, 5 x 5 seconds Scapular row , red, 10 X

## 2016-02-08 ENCOUNTER — Ambulatory Visit: Payer: Self-pay

## 2016-02-09 ENCOUNTER — Ambulatory Visit: Payer: Self-pay | Admitting: Physical Therapy

## 2016-02-09 DIAGNOSIS — R293 Abnormal posture: Secondary | ICD-10-CM

## 2016-02-09 DIAGNOSIS — M5442 Lumbago with sciatica, left side: Secondary | ICD-10-CM

## 2016-02-09 DIAGNOSIS — M436 Torticollis: Secondary | ICD-10-CM

## 2016-02-09 DIAGNOSIS — M25562 Pain in left knee: Secondary | ICD-10-CM

## 2016-02-09 NOTE — Therapy (Signed)
[facility] [address] [place], [place], [address] Phone: [phone]   Fax:  [phone]  Physical Therapy Treatment  Patient Details  Name: Derrick Herman MRN: [id number] Date of Birth: 1980/04/30 Referring Provider: Jacklynn Barnacle, MD  Encounter Date: 02/09/2016      PT End of Session - 02/09/16 1416    Visit Number 4   Number of Visits 16   Date for PT Re-Evaluation 03/18/16   Authorization Type 3rd party insurance    PT Start Time 0215   PT Stop Time 0315   PT Time Calculation (min) 60 min      No past medical history on file.  Past Surgical History:  Procedure Laterality Date  . TONSILLECTOMY    . 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      Subjective Assessment - 02/09/16 1418    Subjective Looking up is still difficulty as well as sitting up straight   Currently in Pain? Yes   Pain Score 3    Pain Location Knee   Pain Orientation Left;Anterior   Pain Descriptors / Indicators Sharp  hot   Aggravating Factors  turning on knee   Pain Relieving Factors elevation   Pain Score 4   Pain Location Back  and neck   Pain Orientation Upper;Mid   Pain Descriptors / Indicators Shooting   Pain Radiating Towards left pinky    Aggravating Factors  neck extension, sitting up straight    Pain Relieving Factors heat, slump             [facility] PT Assessment - 02/09/16 0001      AROM   Left Knee Extension -8   Left Knee Flexion 105                     [facility] Adult PT Treatment/Exercise - 02/09/16 0001      Neck Exercises: Supine   Neck Retraction 5 reps   Neck Retraction Limitations 5 seconds, performed with legs in chair for decompression    Other Supine Exercise supine scap stab horiz abdct, pullovers, ER, sashes all with green band    Other Supine Exercise scapula retraction  5 sec x 5 performed with legs in chair for  decompression  Straight leg raise x10  Quad set    x10      Moist Heat Therapy   Number Minutes Moist Heat 15 Minutes   Moist Heat Location Cervical  upper back     Electrical Stimulation   Electrical Stimulation Location neck upper back   Electrical Stimulation Action IFC   Electrical Stimulation Parameters 20   Electrical Stimulation Goals Pain                PT Education - 02/09/16 1448    Education provided Yes   Education Details HEP    Person(s) Educated Patient   Methods Explanation;Handout   Comprehension Verbalized understanding          PT Short Term Goals - 02/09/16 1446      PT SHORT TERM GOAL #1   Title Pt will demo inital HEP    Time 2   Period Weeks   Status On-going     PT SHORT TERM GOAL #2   Title Pt will report pain in upper back decreased 30% or more   Baseline not yet improved   Time  4   Period Weeks   Status On-going     PT SHORT TERM GOAL #3   Title Pt will demo improved Lt knee flexion to 110 degrees and able to lay knee fully extended   Baseline 105   Time 4   Period Weeks   Status On-going     PT SHORT TERM GOAL #4   Title Cervical motion in creased 10 degrees or more with flexion and extension   Baseline radiculopathy is intermittent now, multiple times per day    Time 4   Period Weeks   Status On-going           PT Long Term Goals - 01/21/16 1700      PT LONG TERM GOAL #1   Title Patient will ride in a car for 30 minutes wihout increased pain in order to perfrom daily tasks    Time 8   Period Weeks   Status New     PT LONG TERM GOAL #2   Title Patient will demsotrate full cervical  flexion in order to pick objects off the ground in order to put his shoes on without pain    Time 8   Period Weeks   Status New     PT LONG TERM GOAL #3   Title He will be able to assume as close to normal posture without increaed pain.    Time 8   Period Weeks   Status New     PT LONG TERM GOAL #4   Title He will be  able to walk 1 mile for exercises with 1-2 max pain in knee and neck/upper back   Time 8   Period Weeks   Status New     PT LONG TERM GOAL #5   Title cer vical rotation to 65 degrees or more RT and LT so able to loook behind with min to no pain.    Time 8   Period Weeks   Status New     Additional Long Term Goals   Additional Long Term Goals Yes     PT LONG TERM GOAL #6   Title General pain in upper back and neck will become intermittant and max 2-3/10    Time 8   Period Weeks   Status New               Plan - 02/09/16 1510    Clinical Impression Statement Pt present with lower pain levels in upper back and knee. His knee AROM has improved. He has difficulty sitting upright, therefore HEP is painful. Perfromed HEP in supine decompression position. Progressed to supine scap stab with green band, all tolerated well and added to HEP. Repeated IFC/HMP per pt request as helpful previously.    PT Next Visit Plan Manual for ROM /stiffness , modalities as helpful, review HEP for neck and upper back, check neck ROM/goals    PT Home Exercise Plan Pirifromis stretch with towel;;  single knee to chest, straight leg raise, hamstring stretching,  ROW red band,  neck retraction,  scapular retraction.   Consulted and Agree with Plan of Care Patient      Patient will benefit from skilled therapeutic intervention in order to improve the following deficits and impairments:  Abnormal gait, Decreased activity tolerance, Decreased strength, Decreased mobility, Impaired flexibility, Increased muscle spasms, Increased fascial restricitons, Difficulty walking, Decreased range of motion, Pain, Postural dysfunction  Visit Diagnosis: Stiffness of cervical spine  Abnormal posture  Acute pain of  left knee  Acute left-sided low back pain with left-sided sciatica     Problem List There are no active problems to display for this patient.   Derrick Herman, Derrick Herman , [place] 02/09/2016, 3:17 PM  [facility] [address] [place], [place], [address] Phone: [phone]   Fax:  [phone]  Name: Derrick Herman MRN: [id number] Date of Birth: 10/29/1980

## 2016-02-09 NOTE — Patient Instructions (Signed)
Over Head Pull: Narrow Grip       On back, knees bent, feet flat, band across thighs, elbows straight but relaxed. Pull hands apart (start). Keeping elbows straight, bring arms up and over head, hands toward floor. Keep pull steady on band. Hold momentarily. Return slowly, keeping pull steady, back to start. Repeat _10__ times. Band color ___G___   Side Pull: Double Arm   On back, knees bent, feet flat. Arms perpendicular to body, shoulder level, elbows straight but relaxed. Pull arms out to sides, elbows straight. Resistance band comes across collarbones, hands toward floor. Hold momentarily. Slowly return to starting position. Repeat __10_ times. Band color __G___   Sash   On back, knees bent, feet flat, left hand on left hip, right hand above left. Pull right arm DIAGONALLY (hip to shoulder) across chest. Bring right arm along head toward floor. Hold momentarily. Slowly return to starting position. Repeat __10_ times. Do with left arm. Band color __G____   Shoulder Rotation: Double Arm   On back, knees bent, feet flat, elbows tucked at sides, bent 90, hands palms up. Pull hands apart and down toward floor, keeping elbows near sides. Hold momentarily. Slowly return to starting position. Repeat _10__ times. Band color _G_____   Straight Leg Raise    Tighten stomach and slowly raise locked right leg ___16_ inches from floor. Repeat ___10_ times per set. Do __2__ sets per session. Do _2___ sessions per day.

## 2016-02-10 ENCOUNTER — Ambulatory Visit: Payer: Self-pay | Admitting: Physical Therapy

## 2016-02-15 ENCOUNTER — Ambulatory Visit: Payer: Self-pay

## 2016-02-17 ENCOUNTER — Encounter: Payer: Self-pay | Admitting: Physical Therapy

## 2016-02-17 ENCOUNTER — Ambulatory Visit: Payer: Self-pay | Admitting: Physical Therapy

## 2016-02-17 DIAGNOSIS — M62830 Muscle spasm of back: Secondary | ICD-10-CM

## 2016-02-17 DIAGNOSIS — M436 Torticollis: Secondary | ICD-10-CM

## 2016-02-17 DIAGNOSIS — M25562 Pain in left knee: Secondary | ICD-10-CM

## 2016-02-17 DIAGNOSIS — R262 Difficulty in walking, not elsewhere classified: Secondary | ICD-10-CM

## 2016-02-17 DIAGNOSIS — R293 Abnormal posture: Secondary | ICD-10-CM

## 2016-02-17 DIAGNOSIS — M25612 Stiffness of left shoulder, not elsewhere classified: Secondary | ICD-10-CM

## 2016-02-17 DIAGNOSIS — M5442 Lumbago with sciatica, left side: Secondary | ICD-10-CM

## 2016-02-17 NOTE — Therapy (Signed)
[facility] [address] [place], [place], [address] Phone: [phone]   Fax:  [phone]  Physical Therapy Treatment  Patient Details  Name: Derrick Herman MRN: [id number] Date of Birth: 10/04/1980 Referring Provider: Jacklynn Barnacle, MD  Encounter Date: 02/17/2016      PT End of Session - 02/17/16 1533    Visit Number 5   Number of Visits 16   Date for PT Re-Evaluation 03/18/16   PT Start Time 1418   PT Stop Time 1507   PT Time Calculation (min) 49 min   Activity Tolerance Patient tolerated treatment well   Behavior During Therapy [facility] for tasks assessed/performed      History reviewed. No pertinent past medical history.  Past Surgical History:  Procedure Laterality Date  . TONSILLECTOMY    . 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      Subjective Assessment - 02/17/16 1433    Subjective Able to walk a little better.  Neck motion for rotations. improving. Neck flexion Extension increase pain.   Currently in Pain? Yes   Pain Score 3    Pain Location Knee   Pain Orientation Left;Anterior   Pain Descriptors / Indicators Sharp   Pain Type Acute pain   Pain Frequency Constant   Aggravating Factors  walking,  sit to stand, twist   Pain Relieving Factors rest, elevation   Effect of Pain on Daily Activities limits walking   Pain Score --  mild, moderate   Pain Location Back   Pain Orientation Upper;Mid   Pain Descriptors / Indicators Shooting   Pain Type Acute pain   Pain Radiating Towards left arm   Pain Frequency Intermittent   Aggravating Factors  neck extension,  looking down   Pain Relieving Factors heat , slump            [facility] PT Assessment - 02/17/16 0001      AROM   Cervical Flexion WNL post manual                     [facility] Adult PT Treatment/Exercise - 02/17/16 0001      Self-Care   Self-Care Other Self-Care  Comments  sit to stand with swinging hips right to decrease QL pain   Other Self-Care Comments  stand to sit cues to lift chest to prevent loading of back,  less pain notes after a few practiced sits.     Neck Exercises: Supine   Neck Retraction 5 reps  also standing   Capital Flexion Limitations 3 X pre and post manual, less painmanual   Cervical Rotation Limitations 1 x some pain end range,  cued for posture prior     Lumbar Exercises: Stretches   Standing Side Bend Limitations QL stretch, knees slightly bent, standing left arm on mat edge, stretch left QL 3 X 20-10 seconds, HEP,  sittinf side bend with arms crossed his chest, QL stretch 3 X 10 seconds,  HEP,  also reviewed and not practiced sidelying right over folded towel.     Knee/Hip Exercises: Supine   Quad Sets 5 reps   Quad Sets Limitations sore     Manual Therapy   Soft tissue mobilization patient prone over pillow,  Lt quad padded and feet on bolster.  to neck and entire back.  neck >low back.  left QL hypersensitive to palpation and manual not  tolerated,  tissue softened.  Several tight areas softened.  Pain decreased with neck flexion post manual                PT Education - 02/17/16 1533    Education provided Yes   Education Details HEP,  QL info   Person(s) Educated Patient   Methods Explanation;Demonstration;Tactile cues;Verbal cues;Handout   Comprehension Verbalized understanding;Returned demonstration          PT Short Term Goals - 02/17/16 1537      PT SHORT TERM GOAL #1   Title Pt will demo inital HEP    Baseline independent with exercises issued so far   Time 2   Period Weeks   Status On-going     PT SHORT TERM GOAL #2   Title Pt will report pain in upper back decreased 30% or more   Time 4   Period Weeks   Status Unable to assess     PT SHORT TERM GOAL #3   Title Pt will demo improved Lt knee flexion to 110 degrees and able to lay knee fully extended   Time 4   Period Weeks   Status  Unable to assess     PT SHORT TERM GOAL #4   Title Cervical motion in creased 10 degrees or more with flexion and extension   Baseline WNL neck flexion post manual   Time 4   Period Weeks   Status On-going     PT SHORT TERM GOAL #5   Title Patient's PSIS level will be equal L V R    Time 4   Period Weeks   Status Unable to assess           PT Long Term Goals - 01/21/16 1700      PT LONG TERM GOAL #1   Title Patient will ride in a car for 30 minutes wihout increased pain in order to perfrom daily tasks    Time 8   Period Weeks   Status New     PT LONG TERM GOAL #2   Title Patient will demsotrate full cervical  flexion in order to pick objects off the ground in order to put his shoes on without pain    Time 8   Period Weeks   Status New     PT LONG TERM GOAL #3   Title He will be able to assume as close to normal posture without increaed pain.    Time 8   Period Weeks   Status New     PT LONG TERM GOAL #4   Title He will be able to walk 1 mile for exercises with 1-2 max pain in knee and neck/upper back   Time 8   Period Weeks   Status New     PT LONG TERM GOAL #5   Title cer vical rotation to 65 degrees or more RT and LT so able to loook behind with min to no pain.    Time 8   Period Weeks   Status New     Additional Long Term Goals   Additional Long Term Goals Yes     PT LONG TERM GOAL #6   Title General pain in upper back and neck will become intermittant and max 2-3/10    Time 8   Period Weeks   Status New               Plan - 02/17/16 1534    Clinical Impression Statement  Progress toward HEP and ROM goals .  Left QL pain flare noted with soft tissue work.  WNL neck flexion post manual.   PT Next Visit Plan Manual for ROM /stiffness , modalities as helpful, review HEP for neck and upper back, check neck ROM/goals    PT Home Exercise Plan Pirifromis stretch with towel;;  single knee to chest, straight leg raise, hamstring stretching,  ROW red  band,  neck retraction,  scapular retraction.  QL flexion.   Consulted and Agree with Plan of Care Patient      Patient will benefit from skilled therapeutic intervention in order to improve the following deficits and impairments:  Abnormal gait, Decreased activity tolerance, Decreased strength, Decreased mobility, Impaired flexibility, Increased muscle spasms, Increased fascial restricitons, Difficulty walking, Decreased range of motion, Pain, Postural dysfunction  Visit Diagnosis: Stiffness of cervical spine  Abnormal posture  Acute pain of left knee  Acute left-sided low back pain with left-sided sciatica  Muscle spasm of back  Difficulty in walking, not elsewhere classified  Stiffness of left shoulder, not elsewhere classified     Problem List There are no active problems to display for this patient.   Ginia Rudell PTA 02/17/2016, 3:45 PM  [facility] [address] [place], [place], [address] Phone: [phone]   Fax:  [phone]  Name: Derrick SectionKenneth Herman MRN: [id number] Date of Birth: 01/24/1980

## 2016-02-17 NOTE — Patient Instructions (Signed)
QL stretches issued from exercise drawer.   To be done PRN 2-3 X each, 10-20 seconds each, They were modified to accomadate his knee pain and shoulder pain. Lateral trunk stretches standing and side lying were issued.

## 2016-02-18 ENCOUNTER — Ambulatory Visit: Payer: Self-pay

## 2016-02-22 ENCOUNTER — Ambulatory Visit: Payer: Self-pay | Admitting: Physical Therapy

## 2016-02-22 DIAGNOSIS — M25562 Pain in left knee: Secondary | ICD-10-CM

## 2016-02-22 DIAGNOSIS — M25612 Stiffness of left shoulder, not elsewhere classified: Secondary | ICD-10-CM

## 2016-02-22 DIAGNOSIS — R262 Difficulty in walking, not elsewhere classified: Secondary | ICD-10-CM

## 2016-02-22 DIAGNOSIS — M5442 Lumbago with sciatica, left side: Secondary | ICD-10-CM

## 2016-02-22 DIAGNOSIS — M62830 Muscle spasm of back: Secondary | ICD-10-CM

## 2016-02-22 DIAGNOSIS — M436 Torticollis: Secondary | ICD-10-CM

## 2016-02-22 DIAGNOSIS — R293 Abnormal posture: Secondary | ICD-10-CM

## 2016-02-22 NOTE — Therapy (Signed)
[facility] [address] [place], [place], [address] Phone: [phone]   Fax:  [phone]  Physical Therapy Treatment  Patient Details  Name: Derrick Herman Stanzione MRN: [id number] Date of Birth: 01/09/1981 Referring Provider: Jacklynn BarnacleN Xu, MD  Encounter Date: 02/22/2016      PT End of Session - 02/22/16 1705    Visit Number 6   Number of Visits 16   Date for PT Re-Evaluation 03/18/16   PT Start Time 1633   PT Stop Time 1720   PT Time Calculation (min) 47 min   Activity Tolerance Patient tolerated treatment well  patient limited by being sick recently   Behavior During Therapy [facility] for tasks assessed/performed      No past medical history on file.  Past Surgical History:  Procedure Laterality Date  . TONSILLECTOMY    . 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      Subjective Assessment - 02/22/16 1636    Subjective I was sick in bed since Thursday.  I am not contageous.  I dson't have a fever.  I was sick so I did no get to exewrcises much.   i was able to setetch this morning .   walking is a little better with being off it most of the weekend.  Shape pain under knee cap is improving.    Currently in Pain? Yes   Pain Score 5    Pain Orientation Left;Anterior   Aggravating Factors  sit to stsant to sit,  sitting low, twisting,     Pain Score 5   Pain Location Back   Pain Orientation Upper;Mid   Pain Descriptors / Indicators Sore;Sharp   Pain Radiating Towards shoulder blade   Aggravating Factors  extension of neck and neck rotation to the right,  side bend to right>Left   Pain Relieving Factors heat, slump                         [facility] Adult PT Treatment/Exercise - 02/22/16 0001      Lumbar Exercises: Supine   Other Supine Lumbar Exercises Decompression.,,   5 minutes     Knee/Hip Exercises: Aerobic   Nustep UE/LE   150 steps  5/10     Moist Heat Therapy   Number Minutes Moist Heat 20 Minutes   Moist Heat Location Cervical  upper back     Electrical Stimulation   Electrical Stimulation Location neck, upper back   Electrical Stimulation Action IFC   Electrical Stimulation Parameters 20   Electrical Stimulation Goals Pain                  PT Short Term Goals - 02/22/16 1708      PT SHORT TERM GOAL #1   Title Pt will demo inital HEP    Baseline able to exercise this morning   Time 2   Period Weeks   Status On-going     PT SHORT TERM GOAL #2   Title Pt will report pain in upper back decreased 30% or more   Baseline not yet improved   Time 4   Period Weeks   Status On-going     PT SHORT TERM GOAL #3   Title Pt will demo improved Lt knee flexion to 110 degrees and able to lay knee fully extended   Time 4   Period  Weeks   Status Unable to assess     PT SHORT TERM GOAL #4   Title Cervical motion in creased 10 degrees or more with flexion and extension   Time 4   Period Weeks   Status Unable to assess     PT SHORT TERM GOAL #5   Title Patient's PSIS level will be equal L V R    Time 4   Period Weeks   Status Unable to assess           PT Long Term Goals - 01/21/16 1700      PT LONG TERM GOAL #1   Title Patient will ride in a car for 30 minutes wihout increased pain in order to perfrom daily tasks    Time 8   Period Weeks   Status New     PT LONG TERM GOAL #2   Title Patient will demsotrate full cervical  flexion in order to pick objects off the ground in order to put his shoes on without pain    Time 8   Period Weeks   Status New     PT LONG TERM GOAL #3   Title He will be able to assume as close to normal posture without increaed pain.    Time 8   Period Weeks   Status New     PT LONG TERM GOAL #4   Title He will be able to walk 1 mile for exercises with 1-2 max pain in knee and neck/upper back   Time 8   Period Weeks   Status New     PT LONG TERM GOAL #5    Title cer vical rotation to 65 degrees or more RT and LT so able to loook behind with min to no pain.    Time 8   Period Weeks   Status New     Additional Long Term Goals   Additional Long Term Goals Yes     PT LONG TERM GOAL #6   Title General pain in upper back and neck will become intermittant and max 2-3/10    Time 8   Period Weeks   Status New               Plan - 02/22/16 1706    Clinical Impression Statement Short session due patient's energy level.  Modalities helpful.  Pain increased with staying in bed (Stiff, sore )\\ROtation decreased from last visit to the right.  (Not formally measured)   PT Next Visit Plan Manual for ROM /stiffness , modalities as helpful, review HEP for neck and upper back, check neck ROM/goals    PT Home Exercise Plan Pirifromis stretch with towel;;  single knee to chest, straight leg raise, hamstring stretching,  ROW red band,  neck retraction,  scapular retraction.  QL flexion.   Consulted and Agree with Plan of Care Patient      Patient will benefit from skilled therapeutic intervention in order to improve the following deficits and impairments:  Abnormal gait, Decreased activity tolerance, Decreased strength, Decreased mobility, Impaired flexibility, Increased muscle spasms, Increased fascial restricitons, Difficulty walking, Decreased range of motion, Pain, Postural dysfunction  Visit Diagnosis: Stiffness of cervical spine  Abnormal posture  Acute pain of left knee  Acute left-sided low back pain with left-sided sciatica  Muscle spasm of back  Difficulty in walking, not elsewhere classified  Stiffness of left shoulder, not elsewhere classified     Problem List There are no active problems to  display for this patient.   Brantley Wiley PTA 02/22/2016, 5:11 PM  [facility] [address] [place], [place], [address] Phone: [phone]   Fax:  [phone]  Name: Finnick Orosz MRN: [id number] Date of Birth: 06-18-1980

## 2016-02-24 ENCOUNTER — Ambulatory Visit: Payer: Self-pay | Admitting: Physical Therapy

## 2016-02-24 DIAGNOSIS — M25562 Pain in left knee: Secondary | ICD-10-CM

## 2016-02-24 DIAGNOSIS — M5442 Lumbago with sciatica, left side: Secondary | ICD-10-CM

## 2016-02-24 DIAGNOSIS — R293 Abnormal posture: Secondary | ICD-10-CM

## 2016-02-24 DIAGNOSIS — R262 Difficulty in walking, not elsewhere classified: Secondary | ICD-10-CM

## 2016-02-24 DIAGNOSIS — M436 Torticollis: Secondary | ICD-10-CM

## 2016-02-24 DIAGNOSIS — M62830 Muscle spasm of back: Secondary | ICD-10-CM

## 2016-02-24 NOTE — Therapy (Signed)
[place], [place], [address] Phone: [phone]   Fax:  [phone]  Physical Therapy Treatment  Patient Details  Name: Derrick Herman MRN: [id number] Date of Birth: [birth year] Referring Provider: Dollene Primrose, MD  Encounter Date: 02/24/2016      PT End of Session - 02/24/16 0823    Visit Number 7   Number of Visits 16   Date for PT Re-Evaluation 03/18/16   PT Start Time 0731   PT Stop Time 0802   PT Time Calculation (min) 31 min   Activity Tolerance Patient tolerated treatment well   Behavior During Therapy [facility] for tasks assessed/performed      No past medical history on file.  Past Surgical History:  Procedure Laterality Date  . TONSILLECTOMY    . tumor removal Right    age 55, R knee, [facility]    There were no vitals filed for this visit.      Subjective Assessment - 02/24/16 0732    Subjective I was able to rest 7 hours of sleep, I stretched before coming and it helped the pain.  Less pain under the Knee cap.   Currently in Pain? Yes   Pain Score --  a little bit   Pain Location Knee   Pain Orientation Left;Anterior   Pain Type Acute pain   Pain Frequency Intermittent  worse as the day goes on   Aggravating Factors  worse at the end of a busy day   Pain Relieving Factors elevation   Effect of Pain on Daily Activities limits activities   Pain Score 4   Pain Location Back            [facility] PT Assessment - 02/24/16 0001      AROM   Right Knee Extension --  2 finger's width off mat   Left Knee Flexion 92                     [facility] Adult PT Treatment/Exercise - 02/24/16 0001      Knee/Hip Exercises: Aerobic   Nustep UE/LE 7 minutes  167     Knee/Hip Exercises: Seated   Long Arc Quad 10 reps;2 sets   Sit to [employer] 10 reps  cues, HEP     Knee/Hip Exercises: Supine   Quad Sets 10 reps   Quad Sets Limitations sore,  small roll to press into   [employer] 10 reps;2 sets;AROM   Heel Slides 10 reps   Straight Leg Raises 10 reps;2 sets   Straight Leg Raises Limitations bent knee     Knee/Hip Exercises: Prone   Hamstring Curl 20 reps   Other Prone Exercises terminal knee extension 10 x,   HEP                PT Education - 02/24/16 1700    Education provided Yes   Education Details HEP   Person(s) Educated Patient   Methods Explanation;Demonstration;Verbal cues;Handout;Tactile cues   Comprehension Verbalized understanding;Returned demonstration          PT Short Term Goals - 02/22/16 1708      PT SHORT TERM GOAL #1   Title Pt will demo inital HEP    Baseline able to exercise this morning   Time 2   Period Weeks   Status On-going     PT SHORT TERM GOAL #2   Title Pt will report pain in upper back decreased 30%  or more   Baseline not yet improved   Time 4   Period Weeks   Status On-going     PT SHORT TERM GOAL #3   Title Pt will demo improved Lt knee flexion to 110 degrees and able to lay knee fully extended   Time 4   Period Weeks   Status Unable to assess     PT SHORT TERM GOAL #4   Title Cervical motion in creased 10 degrees or more with flexion and extension   Time 4   Period Weeks   Status Unable to assess     PT SHORT TERM GOAL #5   Title Patient's PSIS level will be equal L V R    Time 4   Period Weeks   Status Unable to assess           PT Long Term Goals - 01/21/16 1700      PT LONG TERM GOAL #1   Title Patient will ride in a car for 30 minutes wihout increased pain in order to perfrom daily tasks    Time 8   Period Weeks   Status New     PT LONG TERM GOAL #2   Title Patient will demsotrate full cervical  flexion in order to pick objects off the ground in order to put his shoes on without pain    Time 8   Period Weeks   Status New     PT LONG TERM GOAL #3   Title He will be able to assume as close to normal posture without increaed pain.    Time 8   Period  Weeks   Status New     PT LONG TERM GOAL #4   Title He will be able to walk 1 mile for exercises with 1-2 max pain in knee and neck/upper back   Time 8   Period Weeks   Status New     PT LONG TERM GOAL #5   Title cer vical rotation to 65 degrees or more RT and LT so able to loook behind with min to no pain.    Time 8   Period Weeks   Status New     Additional Long Term Goals   Additional Long Term Goals Yes     PT LONG TERM GOAL #6   Title General pain in upper back and neck will become intermittant and max 2-3/10    Time 8   Period Weeks   Status New               Plan - 02/24/16 0825    Clinical Impression Statement 92 to 2 finger's width off mat AROM left knee prior to session.  level 1 and 2 home exercises issued for knee.  Partient says he is feeling a lot better than when he first started PT .  No New goals met. 4/10 knee pain at end of session.  he "Felt Looser".  He declined the need for modalities.   PT Next Visit Plan review knee exercises (L1,2) work toward goals.  FOTO.     PT Home Exercise Plan Pirifromis stretch with towel;;  single knee to chest, straight leg raise, hamstring stretching,  ROW red band,  neck retraction,  scapular retraction.  QL flexion.  L1,2 knee.   Consulted and Agree with Plan of Care Patient      Patient will benefit from skilled therapeutic intervention in order to improve the following deficits and impairments:  Abnormal gait, Decreased activity tolerance, Decreased strength, Decreased mobility, Impaired flexibility, Increased muscle spasms, Increased fascial restricitons, Difficulty walking, Decreased range of motion, Pain, Postural dysfunction  Visit Diagnosis: Stiffness of cervical spine  Abnormal posture  Acute pain of left knee  Acute left-sided low back pain with left-sided sciatica  Muscle spasm of back  Difficulty in walking, not elsewhere classified     Problem List There are no active problems to display for  this patient.   Derrick Herman  PTA 02/24/2016, 8:30 AM  [place], [place], [address] Phone: [phone]   Fax:  [phone]  Name: Derrick Herman MRN: [id number] Date of Birth: 1980-07-07

## 2016-02-24 NOTE — Patient Instructions (Signed)
From exercise drawer: Knee exercises, some are re-issued. 1-2 x a day 10-20 X 0 to 5 second holds. Quad sets, SAQ, LAQ, SLR, heel slides, sit to stand, terminal knee extension,  Prone knee flexion.

## 2016-02-29 ENCOUNTER — Ambulatory Visit: Payer: Self-pay | Attending: Orthopaedic Surgery | Admitting: Physical Therapy

## 2016-02-29 DIAGNOSIS — M62830 Muscle spasm of back: Secondary | ICD-10-CM | POA: Insufficient documentation

## 2016-02-29 DIAGNOSIS — R293 Abnormal posture: Secondary | ICD-10-CM | POA: Insufficient documentation

## 2016-02-29 DIAGNOSIS — M25562 Pain in left knee: Secondary | ICD-10-CM | POA: Insufficient documentation

## 2016-02-29 DIAGNOSIS — M25612 Stiffness of left shoulder, not elsewhere classified: Secondary | ICD-10-CM | POA: Insufficient documentation

## 2016-02-29 DIAGNOSIS — M436 Torticollis: Secondary | ICD-10-CM | POA: Insufficient documentation

## 2016-02-29 DIAGNOSIS — R262 Difficulty in walking, not elsewhere classified: Secondary | ICD-10-CM | POA: Insufficient documentation

## 2016-02-29 NOTE — Therapy (Signed)
[place], [place], [address] Phone: [phone]   Fax:  [phone]  Physical Therapy Treatment  Patient Details  Name: Derrick Herman MRN: [id number] Date of Birth: 12-Aug-1980 Referring Provider: Dollene Primrose, MD  Encounter Date: 02/29/2016      PT End of Session - 02/29/16 0857    Visit Number 8   Number of Visits 16   Date for PT Re-Evaluation 03/18/16   PT Start Time 0733   PT Stop Time 0815   PT Time Calculation (min) 42 min   Activity Tolerance Patient tolerated treatment well   Behavior During Therapy [facility] for tasks assessed/performed      No past medical history on file.  Past Surgical History:  Procedure Laterality Date  . TONSILLECTOMY    . 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      Subjective Assessment - 02/29/16 0738    Subjective I have been doing the knee exercises.  4/10   Currently in Pain? Yes   Pain Score 4    Pain Location Knee  stiff   Pain Orientation Left;Anterior   Pain Descriptors / Indicators Sharp;Stabbing;Shooting   Pain Type Acute pain   Pain Frequency Intermittent   Aggravating Factors  sitting longer,  random sharp   Pain Relieving Factors elevating   Effect of Pain on Daily Activities needs to prepare for longer car drives.   Pain Location Back   Pain Orientation Upper;Mid   Pain Descriptors / Indicators Stabbing;Sharp;Constant   Pain Type Acute pain   Pain Radiating Towards mid thoracic,  noted arm tingling, numbness with driving his car 30 minutes this past weekend.    Pain Frequency Constant   Aggravating Factors  driving   Pain Relieving Factors trying to put my shoulders back            [facility] PT Assessment - 02/29/16 0001      AROM   Left Knee Flexion 110  measured priot to exercise                     [facility] Adult PT Treatment/Exercise - 02/29/16 0001       Neck Exercises: Supine   Neck Retraction 5 reps     Lumbar Exercises: Stretches   Single Knee to Chest Stretch 1 rep;10 seconds  each   Piriformis Stretch 3 reps;10 seconds  left, right X1,  cued for hand placement to avoid knee pain.     Lumbar Exercises: Supine   Other Supine Lumbar Exercises rolled towel between shoulder blades arms 90, hiph/knees flexed  5 minutes     Knee/Hip Exercises: Supine   Bridges Limitations 10   cues to stay on task     Knee/Hip Exercises: Sidelying   Clams 5  both     Shoulder Exercises: Stretch   Other Shoulder Stretches book opener5 X  limited by pain both      Moist Heat Therapy   Number Minutes Moist Heat 15 Minutes   Moist Heat Location Cervical  and mid to lower back                  PT Short Term Goals - 02/29/16 0902      PT SHORT TERM GOAL #1   Title Pt will demo inital HEP    Baseline independent with knee exercises   Time  2   Period Weeks   Status On-going     PT SHORT TERM GOAL #2   Title Pt will report pain in upper back decreased 30% or more   Baseline pain improves at times,  not yet consistant,  Severe pain intermittantly   Time 4   Period Weeks   Status On-going     PT SHORT TERM GOAL #3   Title Pt will demo improved Lt knee flexion to 110 degrees and able to lay knee fully extended   Baseline 110, consistant ?   Time 4   Period Weeks   Status Partially Met     PT SHORT TERM GOAL #4   Title Cervical motion in creased 10 degrees or more with flexion and extension   Time 4   Period Weeks   Status Unable to assess     PT SHORT TERM GOAL #5   Title Patient's PSIS level will be equal L V R    Time 4   Period Weeks   Status Unable to assess           PT Long Term Goals - 01/21/16 1700      PT LONG TERM GOAL #1   Title Patient will ride in a car for 30 minutes wihout increased pain in order to perfrom daily tasks    Time 8   Period Weeks   Status New     PT LONG TERM GOAL #2    Title Patient will demsotrate full cervical  flexion in order to pick objects off the ground in order to put his shoes on without pain    Time 8   Period Weeks   Status New     PT LONG TERM GOAL #3   Title He will be able to assume as close to normal posture without increaed pain.    Time 8   Period Weeks   Status New     PT LONG TERM GOAL #4   Title He will be able to walk 1 mile for exercises with 1-2 max pain in knee and neck/upper back   Time 8   Period Weeks   Status New     PT LONG TERM GOAL #5   Title cer vical rotation to 65 degrees or more RT and LT so able to loook behind with min to no pain.    Time 8   Period Weeks   Status New     Additional Long Term Goals   Additional Long Term Goals Yes     PT LONG TERM GOAL #6   Title General pain in upper back and neck will become intermittant and max 2-3/10    Time 8   Period Weeks   Status New               Plan - 02/29/16 7672    Clinical Impression Statement 110 AROM Left knee.  Progress toward knee ROM goal today, partially met.  Patient has been comlpiant with his knee exercises.  Sharp pain thoracic ares noted with bookopeners,  better with cues.    PT Next Visit Plan FOTO,  work toward goals,  measure knee again      Patient will benefit from skilled therapeutic intervention in order to improve the following deficits and impairments:  Abnormal gait, Decreased activity tolerance, Decreased strength, Decreased mobility, Impaired flexibility, Increased muscle spasms, Increased fascial restricitons, Difficulty walking, Decreased range of motion, Pain, Postural dysfunction  Visit Diagnosis: Stiffness of  cervical spine  Abnormal posture  Acute pain of left knee  Muscle spasm of back  Difficulty in walking, not elsewhere classified  Stiffness of left shoulder, not elsewhere classified     Problem List There are no active problems to display for this patient.   Blair Mesina PTA 02/29/2016, 9:05  AM  [facility] [address] [place], [place], [address] Phone: [phone]   Fax:  [phone]  Name: Gavynn Duvall MRN: [id number] Date of Birth: 1980/07/27

## 2016-03-02 ENCOUNTER — Ambulatory Visit: Payer: Self-pay | Admitting: Physical Therapy

## 2016-03-04 ENCOUNTER — Ambulatory Visit: Payer: Self-pay

## 2016-03-04 DIAGNOSIS — M25562 Pain in left knee: Secondary | ICD-10-CM

## 2016-03-04 DIAGNOSIS — M62830 Muscle spasm of back: Secondary | ICD-10-CM

## 2016-03-04 DIAGNOSIS — M436 Torticollis: Secondary | ICD-10-CM

## 2016-03-04 DIAGNOSIS — R293 Abnormal posture: Secondary | ICD-10-CM

## 2016-03-04 DIAGNOSIS — R262 Difficulty in walking, not elsewhere classified: Secondary | ICD-10-CM

## 2016-03-04 NOTE — Therapy (Addendum)
[place], [place], [address] Phone: [phone]   Fax:  [phone]  Physical Therapy Treatment/ [place]  Patient Details  Name: Derrick Herman MRN: [id number] Date of Birth: 20-Nov-1980 Referring Provider: Dollene Primrose, MD  Encounter Date: 03/04/2016      PT End of Session - 03/04/16 0806    Visit Number 9   Number of Visits 16   Date for PT Re-Evaluation 03/18/16   Authorization Type 3rd party insurance    PT Start Time [phone]   PT Stop Time 0900   PT Time Calculation (min) 54 min   Activity Tolerance Patient tolerated treatment well   Behavior During Therapy [facility] for tasks assessed/performed      No past medical history on file.  Past Surgical History:  Procedure Laterality Date  . TONSILLECTOMY    . 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      Subjective Assessment - 03/04/16 0810    Subjective Neck has been killing me past week    Currently in Pain? Yes   Pain Score 3   as day goes on   Pain Location Knee   Pain Orientation Right;Anterior   Pain Descriptors / Indicators Sharp   Pain Type --  sub acute   Pain Onset More than a month ago   Pain Frequency Intermittent   Aggravating Factors  sitting    Pain Relieving Factors restiing   Multiple Pain Sites Yes   Pain Score 6   Pain Location Neck  mid back   Pain Orientation Upper;Mid;Posterior;Right;Left  RT more   Pain Descriptors / Indicators Stabbing;Sharp;Constant   Pain Type --  sub acute   Pain Radiating Towards between scapula, arms tingle, numbness with driving   Pain Frequency Constant   Aggravating Factors  driving   Pain Relieving Factors erect posture            [facility] PT Assessment - 03/04/16 0001      Observation/Other Assessments   Focus on Therapeutic Outcomes (FOTO)  50% limited  decreased 20 %     Posture/Postural Control   Posture  Comments forward head rounded shoulders , slumped sitting  , sway back     AROM   Left Knee Extension -5   Left Knee Flexion 108  passive 130 flexion   Cervical Flexion 25   Cervical Extension 22   Cervical - Right Side Bend 28   Cervical - Left Side Bend 22   Cervical - Right Rotation 53   Cervical - Left Rotation 46   Lumbar Flexion 55   Lumbar Extension 20   Lumbar - Right Side Bend 18   Lumbar - Left Side Bend 21                     [facility] Adult PT Treatment/Exercise - 03/04/16 0001      Neck Exercises: Theraband   Shoulder External Rotation 20 reps;Red   Horizontal ABduction 20 reps;Red  also 10 with manual cervical retraction   Horizontal ABduction Limitations cued for posture     Neck Exercises: Supine   Neck Retraction 5 reps     Knee/Hip Exercises: Aerobic   Nustep Ue/Le L5 7 min     Moist Heat Therapy   Number Minutes Moist Heat 15 Minutes   Moist Heat Location Cervical  and back  Manual Therapy   Manual Therapy Manual Traction;Passive ROM   Passive ROM Cervical rotation nand sidebending and cervical and upper thoracic retraction/extension    Manual Traction Gr 3-4 and with extesnion neck                  PT Short Term Goals - 03/04/16 0809      PT SHORT TERM GOAL #1   Title Pt will demo inital HEP    Baseline independent with knee exercises, neck pain limits neck exercises    Status Partially Met     PT SHORT TERM GOAL #2   Title Pt will report pain in upper back decreased 30% or more   Baseline pain improves at times,  not yet consistant,  Severe pain intermittantly   Status On-going     PT SHORT TERM GOAL #3   Title Pt will demo improved Lt knee flexion to 110 degrees and able to lay knee fully extended   Status Partially Met     PT SHORT TERM GOAL #4   Title Cervical motion in creased 10 degrees or more with flexion and extension           PT Long Term Goals - 03/04/16 4540      PT LONG TERM GOAL #1   Title  Patient will ride in a car for 30 minutes without increased pain in order to perfrom daily tasks    Status On-going     PT LONG TERM GOAL #2   Title Patient will demsotrate full cervical  flexion in order to pick objects off the ground in order to put his shoes on without pain    Baseline range improved not full   Status On-going     PT LONG TERM GOAL #3   Title He will be able to assume as close to normal posture without increased pain.    Status On-going     PT LONG TERM GOAL #4   Title He will be able to walk 1 mile for exercises with 1-2 max pain in knee and neck/upper back   Baseline 1/2 mile   Status On-going     PT LONG TERM GOAL #5   Title cer vical rotation to 65 degrees or more RT and LT so able to look behind with min to no pain.    Status On-going     PT LONG TERM GOAL #6   Title General pain in upper back and neck will become intermittant and max 2-3/10    Status On-going               Plan - 03/04/16 0848    Clinical Impression Statement ROM knee full passive, Back ROM improved , pain levels down and FOTO score for back decreased 20% from 11/2015. Neck /upper back still with high pain levels. Slow progress   PT Treatment/Interventions ADLs/Self Care Home Management;Cryotherapy;Electrical Stimulation;Stair training;Gait training;Ultrasound;Moist Heat;Iontophoresis 66m/ml Dexamethasone;Dry needling;Passive range of motion;Therapeutic activities;Therapeutic exercise;Neuromuscular re-education;Taping;Splinting;Visual/perceptual remediation/compensation   PT Next Visit Plan ,  work toward goals,  manual for ROM    PT Home Exercise Plan Pirifromis stretch with towel;;  single knee to chest, straight leg raise, hamstring stretching,  ROW red band,  neck retraction,  scapular retraction.  QL flexion.  L1,2 knee.   Consulted and Agree with Plan of Care Patient      Patient will benefit from skilled therapeutic intervention in order to improve the following deficits and  impairments:  Abnormal gait, Decreased  activity tolerance, Decreased strength, Decreased mobility, Impaired flexibility, Increased muscle spasms, Increased fascial restricitons, Difficulty walking, Decreased range of motion, Pain, Postural dysfunction  Visit Diagnosis: Stiffness of cervical spine  Abnormal posture  Acute pain of left knee  Muscle spasm of back  Difficulty in walking, not elsewhere classified     Problem List There are no active problems to display for this patient.   Derrick Herman  PT 03/04/2016, 8:50 AM  [facility] [address] [place], [place], [address] Phone: [phone]   Fax:  [phone]  Name: Derrick Herman MRN: [id number] Date of Birth: Jun 23, 1980  PHYSICAL THERAPY DISCHARGE SUMMARY  Visits from Start of Care:9  Current functional level related to goals / functional outcomes: See above He did not return after this visit   Remaining deficits: See above   Education / Equipment: HEP Plan:                                                    Patient goals were partially met. Patient is being discharged due to not returning since the last visit.  ?????    Derrick Herman  Pt  04/05/16   11:26 AM

## 2016-04-11 ENCOUNTER — Ambulatory Visit (INDEPENDENT_AMBULATORY_CARE_PROVIDER_SITE_OTHER): Payer: Self-pay | Admitting: Orthopaedic Surgery

## 2016-04-11 ENCOUNTER — Encounter (INDEPENDENT_AMBULATORY_CARE_PROVIDER_SITE_OTHER): Payer: Self-pay | Admitting: Orthopaedic Surgery

## 2016-04-11 DIAGNOSIS — M545 Low back pain, unspecified: Secondary | ICD-10-CM

## 2016-04-11 NOTE — Progress Notes (Signed)
   Office Visit Note   Patient: Derrick Herman           Date of Birth: 04/25/1980           MRN: [id number] Visit Date: 04/11/2016              Requested by: No referring provider defined for this encounter. PCP: No PCP Per Patient   Assessment & Plan: Visit Diagnoses:  1. Acute midline low back pain without sciatica     Plan: Recommend continued physical therapy. The numbness is only very occasional. From my standpoint I do not find any focal findings that warrant an MRI. He needs to continue with physical therapy until discharge. Follow-up with me as needed.  Follow-Up Instructions: Return if symptoms worsen or fail to improve.   Orders:  No orders of the defined types were placed in this encounter.  No orders of the defined types were placed in this encounter.     Procedures: No procedures performed   Clinical Data: No additional findings.   Subjective: Chief Complaint  Patient presents with  . Lower Back - Pain, Follow-up    Patient comes back today for low back pain and upper thoracic pain. He is doing home exercises and physical therapy. Overall he is doing better. He has occasional numbness in his left hand with throbbing pain. The pain is mainly in the periscapular area. He did have a lumbar MRI which is essentially negative.    Review of Systems  Constitutional: Negative.   All other systems reviewed and are negative.    Objective: Vital Signs: There were no vitals taken for this visit.  Physical Exam  Constitutional: He is oriented to person, place, and time. He appears well-developed and well-nourished.  Pulmonary/Chest: Effort normal.  Abdominal: Soft.  Neurological: He is alert and oriented to person, place, and time.  Skin: Skin is warm.  Psychiatric: He has a normal mood and affect. His behavior is normal. Judgment and thought content normal.  Nursing note and vitals reviewed.   Ortho Exam Exam of the thoracic spine shows no focal  findings. Bilateral upper extremity exam is normal. He has mild discomfort in the musculature around the scapula. Specialty Comments:  No specialty comments available.  Imaging: No results found.   PMFS History: There are no active problems to display for this patient.  No past medical history on file.  No family history on file.  Past Surgical History:  Procedure Laterality Date  . TONSILLECTOMY    . tumor removal Right    age 36, R knee, [facility]   Social History   Occupational History  . Not on file.   Social History Main Topics  . Smoking status: Current Every Day Smoker  . Smokeless tobacco: Never Used  . Alcohol use No  . Drug use: No  . Sexual activity: Not on file

## 2016-04-20 ENCOUNTER — Ambulatory Visit: Payer: Self-pay | Attending: Orthopaedic Surgery | Admitting: Physical Therapy

## 2016-04-20 DIAGNOSIS — R293 Abnormal posture: Secondary | ICD-10-CM | POA: Insufficient documentation

## 2016-04-20 DIAGNOSIS — M25612 Stiffness of left shoulder, not elsewhere classified: Secondary | ICD-10-CM | POA: Insufficient documentation

## 2016-04-20 DIAGNOSIS — M546 Pain in thoracic spine: Secondary | ICD-10-CM | POA: Insufficient documentation

## 2016-04-20 DIAGNOSIS — M436 Torticollis: Secondary | ICD-10-CM | POA: Insufficient documentation
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
## 2016-04-26 ENCOUNTER — Ambulatory Visit: Payer: Self-pay | Attending: Orthopaedic Surgery | Admitting: Physical Therapy

## 2016-04-26 DIAGNOSIS — M25562 Pain in left knee: Secondary | ICD-10-CM | POA: Insufficient documentation

## 2016-04-26 DIAGNOSIS — M25612 Stiffness of left shoulder, not elsewhere classified: Secondary | ICD-10-CM

## 2016-04-26 DIAGNOSIS — M436 Torticollis: Secondary | ICD-10-CM

## 2016-04-26 DIAGNOSIS — R262 Difficulty in walking, not elsewhere classified: Secondary | ICD-10-CM | POA: Insufficient documentation

## 2016-04-26 DIAGNOSIS — M546 Pain in thoracic spine: Secondary | ICD-10-CM

## 2016-04-26 DIAGNOSIS — M62830 Muscle spasm of back: Secondary | ICD-10-CM | POA: Insufficient documentation

## 2016-04-26 DIAGNOSIS — R293 Abnormal posture: Secondary | ICD-10-CM

## 2016-04-26 DIAGNOSIS — M5442 Lumbago with sciatica, left side: Secondary | ICD-10-CM | POA: Insufficient documentation

## 2016-04-26 NOTE — Therapy (Signed)
[place], [place], [address] Phone: [phone]   Fax:  [phone]  Physical Therapy Treatment  Patient Details  Name: Derrick Herman MRN: [id number] Date of Birth: 08/04/1980 Referring Provider: Dollene Primrose, MD  Encounter Date: 04/26/2016      PT End of Session - 04/26/16 0818    Visit Number 2   Number of Visits 8   Date for PT Re-Evaluation 06/15/16   PT Start Time 0803   PT Stop Time 0850   PT Time Calculation (min) 47 min   Activity Tolerance Patient tolerated treatment well   Behavior During Therapy [facility] for tasks assessed/performed      No past medical history on file.  Past Surgical History:  Procedure Laterality Date  . TONSILLECTOMY    . 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      Subjective Assessment - 04/26/16 0805    Subjective Neck still hurts when I look up.  My back was hurting yesterday, even though I had a relaxing day.  Knee contusion is much better.    Currently in Pain? Yes   Pain Score 4    Pain Location Neck   Pain Orientation Posterior;Right;Left   Pain Descriptors / Indicators Sore;Tightness   Pain Type Chronic pain   Pain Onset More than a month ago   Pain Frequency Intermittent                         [facility] Adult PT Treatment/Exercise - 04/26/16 0819      Shoulder Exercises: Supine   Other Supine Exercises supine scapular stab progression green band x 10 : overhead , horizontal pull, sash and ER     Shoulder Exercises: ROM/Strengthening   UBE (Upper Arm Bike) 6 min no tension , forward      Manual Therapy   Manual therapy comments tolerated deep pressure to L sided trigger points    Joint Mobilization Gr. I -II P/A mobs to middle thoracic spine, rotational spinal mobs    Soft tissue mobilization rhomboids, upper trap , upper and middle back    Myofascial Release L  scapula   Passive ROM cervical rotation and sidebending    Manual Traction cervical spine                PT Education - 04/26/16 0817    Education provided Yes   Education Details posture, scapular stab.    Person(s) Educated Patient   Methods Explanation   Comprehension Verbalized understanding          PT Short Term Goals - 04/26/16 0857      PT SHORT TERM GOAL #1   Title Pt will demo inital HEP for mobility exercises for neck, spine.    Status Partially Met     PT SHORT TERM GOAL #2   Title Pt will report pain in upper back decreased 30% or more   Status On-going           PT Long Term Goals - 04/26/16 0858      PT LONG TERM GOAL #1   Title Patient will ride in a car for 30 minutes without increased pain in order to perfrom daily tasks    Status On-going     PT LONG TERM GOAL #2   Title Patient will demsotrate full cervical  flexion  in order to pick objects off the ground in order to put his shoes on without pain    Status On-going     PT LONG TERM GOAL #3   Title He will be able to assume as close to normal posture without increased pain.    Status On-going     PT LONG TERM GOAL #4   Title He will be able to walk 1 mile for exercises with 1-2 max pain in knee and neck/upper back   Status On-going     PT LONG TERM GOAL #5   Title cervical rotation to 65 degrees or more RT and LT so able to look behind with min to no pain.    Status On-going     PT LONG TERM GOAL #6   Title General pain in upper back and neck will become intermittant and max 2-3/10    Status On-going               Plan - 04/26/16 9892    Clinical Impression Statement Pt with no change in symptoms since reevaluation.  His back pain is intermittent.  He has multiple areas of tendrness, pain in L periscapular mm, including trigger point in L upper trap and levator scap.  Pt able to feel his pinky today after treatment (was numb prior).  no goals met.    PT Next Visit Plan  supine scap stabilization and manual for rhomboid   PT Home Exercise Plan Pirifromis stretch with towel;;  single knee to chest, straight leg raise, hamstring stretching,  ROW red band,  neck retraction,  scapular retraction.  QL flexion.  L1,2 knee.   Consulted and Agree with Plan of Care Patient      Patient will benefit from skilled therapeutic intervention in order to improve the following deficits and impairments:  Abnormal gait, Decreased activity tolerance, Decreased strength, Decreased mobility, Impaired flexibility, Increased muscle spasms, Increased fascial restricitons, Difficulty walking, Decreased range of motion, Pain, Postural dysfunction  Visit Diagnosis: Stiffness of cervical spine  Abnormal posture  Stiffness of left shoulder, not elsewhere classified  Pain in thoracic spine     Problem List There are no active problems to display for this patient.   [name],[name] 04/26/2016, 9:01 AM  [facility] [address] [place], [place], [address] Phone: [phone]   Fax:  [phone]  Name: Derrick Herman MRN: [id number] Date of Birth: 08-Nov-1980

## 2016-05-03 ENCOUNTER — Ambulatory Visit: Payer: Self-pay | Admitting: Physical Therapy

## 2016-05-03 DIAGNOSIS — M436 Torticollis: Secondary | ICD-10-CM

## 2016-05-03 DIAGNOSIS — M25612 Stiffness of left shoulder, not elsewhere classified: Secondary | ICD-10-CM

## 2016-05-03 DIAGNOSIS — R293 Abnormal posture: Secondary | ICD-10-CM

## 2016-05-03 DIAGNOSIS — M546 Pain in thoracic spine: Secondary | ICD-10-CM

## 2016-05-03 NOTE — Patient Instructions (Addendum)
Upper trunk rotation: Lie on your side (Rt). , take L arm up towards the ceiling and open at your chest, keeping an eye on your hand if possible, breathe and then relax.  Do x 5 each side   Rhomboid stretch cross L arm over chest and pull arm up and over to feel in the mid back 30 sec x 3   Use tennis ball against the wall to work on those trigger points between your shoulder blade and spine

## 2016-05-03 NOTE — Therapy (Signed)
[place], [place], [address] Phone: [phone]   Fax:  [phone]  Physical Therapy Treatment  Patient Details  Name: Derrick Herman MRN: [id number] Date of Birth: [birth year] Referring Provider: Dollene Primrose, MD  Encounter Date: 05/03/2016      PT End of Session - 05/03/16 0824    Visit Number 3   Number of Visits 8   Date for PT Re-Evaluation 06/15/16   Authorization Type 3rd party insurance    PT Start Time 0805   PT Stop Time [id number]   PT Time Calculation (min) 41 min   Activity Tolerance Patient tolerated treatment well   Behavior During Therapy [facility] for tasks assessed/performed      No past medical history on file.  Past Surgical History:  Procedure Laterality Date  . TONSILLECTOMY    . 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      Subjective Assessment - 05/03/16 0809    Subjective Still having a good amt of pain in my mid back and sometimes low back.  L knee started hurting a bit more this weekend.    Currently in Pain? Yes   Pain Score 5    Pain Location Back   Pain Orientation Left;Mid   Pain Descriptors / Indicators Tightness;Aching   Pain Type Chronic pain   Pain Onset More than a month ago   Pain Frequency Intermittent            [facility] Adult PT Treatment/Exercise - 05/03/16 0811      Shoulder Exercises: Standing   Horizontal ABduction Strengthening;Both;15 reps   Other Standing Exercises standing scapular stabilization red band at the wall for posture cueing    Other Standing Exercises overhead, ER, sash, diagonal x 10-15 reps      Shoulder Exercises: Stretch   Corner Stretch 3 reps;30 seconds   Cross Chest Stretch 3 reps;30 seconds   Cross Chest Stretch Limitations rhomboids    Other Shoulder Stretches sidelying upper trunk rotation x 5 each    Other Shoulder Stretches thoracic extension over 1/2  foam roller 4 trials, sharp pain more proximal cervical spine     Manual Therapy   Joint Mobilization scapular mobs and subscap release    Myofascial Release L scap                PT Education - 05/03/16 0846    Education provided Yes   Education Details self care, tennis ball for trigger point release new exercises HEP    Person(s) Educated Patient   Methods Explanation;Demonstration;Verbal cues;Handout   Comprehension Verbalized understanding;Returned demonstration          PT Short Term Goals - 05/03/16 0824      PT SHORT TERM GOAL #1   Title Pt will demo inital HEP for mobility exercises for neck, spine.    Status Partially Met     PT SHORT TERM GOAL #2   Title Pt will report pain in upper back decreased 30% or more   Status On-going     PT SHORT TERM GOAL #3   Title Pt will demo improved Lt knee flexion to 110 degrees and able to lay knee fully extended   Status Achieved     PT SHORT TERM GOAL #4   Title Cervical motion increased 10 degrees or more with flexion and extension   Baseline painful ext  and end range flexion    Status On-going     PT SHORT TERM GOAL #5   Title Patient's PSIS level will be equal L V R    Status Deferred           PT Long Term Goals - 05/03/16 0825      PT LONG TERM GOAL #1   Title Patient will ride in a car for 30 minutes without increased pain in order to perfrom daily tasks    Status Achieved     PT LONG TERM GOAL #2   Title Patient will demsotrate full cervical  flexion in order to pick objects off the ground in order to put his shoes on without pain    Baseline end range pain    Status Partially Met     PT LONG TERM GOAL #3   Title He will be able to assume as close to normal posture without increased pain.    Status On-going     PT LONG TERM GOAL #4   Title He will be able to walk 1 mile for exercises with 1-2 max pain in knee and neck/upper back     PT LONG TERM GOAL #5   Title cervical rotation to 65  degrees or more RT and LT so able to look behind with min to no pain.                Plan - 05/03/16 0849    Clinical Impression Statement Pt cont to have pain with cervical extension pain, soreness in L. scapular area.  Decreased thoracic ROM and mobility noted today, added to HEP to address this.  Limited in C AROM and but can do more (functional activities) with less pain.    PT Next Visit Plan scapulo thoracic mobility (foam roller)  and manual for rhomboid, try [place]?    PT Home Exercise Plan Pirifromis stretch with towel;;  single knee to chest, straight leg raise, hamstring stretching,  ROW red band,  neck retraction,  scapular retraction.  QL flexion.  L1,2 knee.   Consulted and Agree with Plan of Care Patient      Patient will benefit from skilled therapeutic intervention in order to improve the following deficits and impairments:  Abnormal gait, Decreased activity tolerance, Decreased strength, Decreased mobility, Impaired flexibility, Increased muscle spasms, Increased fascial restricitons, Difficulty walking, Decreased range of motion, Pain, Postural dysfunction  Visit Diagnosis: Abnormal posture  Stiffness of left shoulder, not elsewhere classified  Stiffness of cervical spine  Pain in thoracic spine     Problem List There are no active problems to display for this patient.   [name],[name] 05/03/2016, 10:01 AM  [place], [place], [address] Phone: [phone]   Fax:  [phone]  Name: Derrick Herman MRN: [id number] Date of Birth: 19-Feb-1980   Raeford Razor, PT 05/03/16 10:01 AM Phone: [phone] Fax: [phone]

## 2016-05-10 ENCOUNTER — Ambulatory Visit: Payer: Self-pay | Admitting: Physical Therapy

## 2016-05-10 ENCOUNTER — Encounter: Payer: Self-pay | Admitting: Physical Therapy

## 2016-05-10 DIAGNOSIS — R262 Difficulty in walking, not elsewhere classified: Secondary | ICD-10-CM

## 2016-05-10 DIAGNOSIS — M62830 Muscle spasm of back: Secondary | ICD-10-CM

## 2016-05-10 DIAGNOSIS — M436 Torticollis: Secondary | ICD-10-CM

## 2016-05-10 DIAGNOSIS — M546 Pain in thoracic spine: Secondary | ICD-10-CM

## 2016-05-10 DIAGNOSIS — M25562 Pain in left knee: Secondary | ICD-10-CM

## 2016-05-10 DIAGNOSIS — M25612 Stiffness of left shoulder, not elsewhere classified: Secondary | ICD-10-CM

## 2016-05-10 DIAGNOSIS — R293 Abnormal posture: Secondary | ICD-10-CM

## 2016-05-10 NOTE — Patient Instructions (Signed)
Sitting posture information issued from Exercise drawer,

## 2016-05-10 NOTE — Therapy (Signed)
[place], [place], [address] Phone: [phone]   Fax:  [phone]  Physical Therapy Treatment  Patient Details  Name: Derrick Herman MRN: [id number] Date of Birth: 04-Dec-1980 Referring Provider: Dollene Primrose, MD  Encounter Date: 05/10/2016      PT End of Session - 05/10/16 1329    Visit Number 4   Number of Visits 8   Date for PT Re-Evaluation 06/15/16   PT Start Time 0803   PT Stop Time 0900   PT Time Calculation (min) 57 min   Activity Tolerance Patient tolerated treatment well   Behavior During Therapy [facility] for tasks assessed/performed      History reviewed. No pertinent past medical history.  Past Surgical History:  Procedure Laterality Date  . TONSILLECTOMY    . 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      Subjective Assessment - 05/10/16 0809    Subjective Yesterday 6-7/10 Today 4/10/  Tried tennis ball.  Pain worse for no reason. Tries to do normal routine. My Grandmother says my pain is due to my posture.  Neck is not bothering me too much today.   Currently in Pain? Yes   Pain Score 4    Pain Location Back   Pain Orientation Left;Mid   Pain Descriptors / Indicators Tightness;Aching   Pain Type Chronic pain   Pain Radiating Towards L t UE   Pain Frequency Intermittent   Aggravating Factors  Sleeping wrong way. Sme exercises, (Band horizontal abduction)   Pain Relieving Factors tennis balls,  stretch breaks.   Pain Location --  Neck pain not mentioned yet today                         [facility] Adult PT Treatment/Exercise - 05/10/16 0001      Shoulder Exercises: Supine   Other Supine Exercises supine scapular stab progression green band x 10 : overhead , horizontal pull, sash and ER   Other Supine Exercises Foam roller series, pink, flexion, extension, horizontal abduction,  nd alternate reach  with march, bent knee 10 X     Moist Heat Therapy   Number Minutes Moist Heat 10 Minutes   Moist Heat Location --  upper back scapular area sidelying     Manual Therapy   Manual Therapy Soft tissue mobilization   Soft tissue mobilization Sidelying with instrument peri  scapulat.  Teres more tender ,  tissue softened                PT Education - 05/10/16 1328    Education provided Yes   Education Details sitting posture   Person(s) Educated Patient   Methods Explanation;Demonstration;Handout   Comprehension Verbalized understanding;Returned demonstration;Need further instruction          PT Short Term Goals - 05/10/16 1334      PT SHORT TERM GOAL #1   Title Pt will demo inital HEP for mobility exercises for neck, spine.    Baseline likes to do the stretches   Time 2   Period Weeks   Status Unable to assess     PT SHORT TERM GOAL #2   Title Pt will report pain in upper back decreased 30% or more   Baseline pain improves at times,  not yet consistant,     Time 4   Period Weeks  Status On-going     PT SHORT TERM GOAL #3   Title Pt will demo improved Lt knee flexion to 110 degrees and able to lay knee fully extended   Time 4   Period Weeks   Status Achieved     PT SHORT TERM GOAL #4   Title Cervical motion increased 10 degrees or more with flexion and extension   Time 4   Period Weeks   Status Unable to assess     PT SHORT TERM GOAL #5   Title Patient's PSIS level will be equal L V R    Status Deferred           PT Long Term Goals - 05/03/16 0825      PT LONG TERM GOAL #1   Title Patient will ride in a car for 30 minutes without increased pain in order to perfrom daily tasks    Status Achieved     PT LONG TERM GOAL #2   Title Patient will demsotrate full cervical  flexion in order to pick objects off the ground in order to put his shoes on without pain    Baseline end range pain    Status Partially Met     PT LONG TERM GOAL #3   Title He  will be able to assume as close to normal posture without increased pain.    Status On-going     PT LONG TERM GOAL #4   Title He will be able to walk 1 mile for exercises with 1-2 max pain in knee and neck/upper back     PT LONG TERM GOAL #5   Title cervical rotation to 65 degrees or more RT and LT so able to look behind with min to no pain.                Plan - 05/10/16 1329    Clinical Impression Statement posture greatly improved post foam roller exercises.  Pain continues to improve with thew stretches.    PT Next Visit Plan scapulo thoracic mobility (foam roller)  and manual for rhomboid, try [place]?    PT Home Exercise Plan Pirifromis stretch with towel;;  single knee to chest, straight leg raise, hamstring stretching,  ROW red band,  neck retraction,  scapular retraction.  QL flexion.  L1,2 knee.   Consulted and Agree with Plan of Care Patient      Patient will benefit from skilled therapeutic intervention in order to improve the following deficits and impairments:  Abnormal gait, Decreased activity tolerance, Decreased strength, Decreased mobility, Impaired flexibility, Increased muscle spasms, Increased fascial restricitons, Difficulty walking, Decreased range of motion, Pain, Postural dysfunction  Visit Diagnosis: Abnormal posture  Stiffness of left shoulder, not elsewhere classified  Stiffness of cervical spine  Pain in thoracic spine  Acute pain of left knee  Muscle spasm of back  Difficulty in walking, not elsewhere classified     Problem List There are no active problems to display for this patient.   Derrick Herman PTA 05/10/2016, 1:38 PM  [facility] [address] [place], [place], [address] Phone: [phone]   Fax:  [phone]  Name: Derrick Herman MRN: [id number] Date of Birth: 1980/09/27

## 2016-05-17 ENCOUNTER — Ambulatory Visit: Payer: Self-pay | Admitting: Physical Therapy

## 2016-05-23 ENCOUNTER — Ambulatory Visit: Payer: Self-pay | Admitting: Physical Therapy

## 2016-05-23 ENCOUNTER — Encounter: Payer: Self-pay | Admitting: Physical Therapy

## 2016-05-23 DIAGNOSIS — M546 Pain in thoracic spine: Secondary | ICD-10-CM

## 2016-05-23 DIAGNOSIS — R293 Abnormal posture: Secondary | ICD-10-CM

## 2016-05-23 DIAGNOSIS — M25562 Pain in left knee: Secondary | ICD-10-CM

## 2016-05-23 DIAGNOSIS — M62830 Muscle spasm of back: Secondary | ICD-10-CM

## 2016-05-23 DIAGNOSIS — M436 Torticollis: Secondary | ICD-10-CM

## 2016-05-23 DIAGNOSIS — M5442 Lumbago with sciatica, left side: Secondary | ICD-10-CM

## 2016-05-23 DIAGNOSIS — M25612 Stiffness of left shoulder, not elsewhere classified: Secondary | ICD-10-CM

## 2016-05-23 DIAGNOSIS — R262 Difficulty in walking, not elsewhere classified: Secondary | ICD-10-CM

## 2016-05-23 NOTE — Patient Instructions (Signed)
Decompression issued from Ex drawer % X 5 seconds each except for 1st 5-15 minutes daily and PRN

## 2016-05-23 NOTE — Therapy (Signed)
[place], [place], [address] Phone: [phone]   Fax:  [phone]  Physical Therapy Treatment  Patient Details  Name: Derrick Herman MRN: [id number] Date of Birth: Mar 19, 1980 Referring Provider: Dollene Primrose, MD  Encounter Date: 05/23/2016      PT End of Session - 05/23/16 0854    Visit Number 5   Number of Visits 8   Date for PT Re-Evaluation 06/15/16   PT Start Time 0803   PT Stop Time 0843   PT Time Calculation (min) 40 min   Activity Tolerance Patient tolerated treatment well   Behavior During Therapy [facility] for tasks assessed/performed      History reviewed. No pertinent past medical history.  Past Surgical History:  Procedure Laterality Date  . TONSILLECTOMY    . 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      Subjective Assessment - 05/23/16 0806    Subjective Pain is about the same.  Foam roller exercises made me sore the next morning and I was sore, aggravated for 5 days.  ( lower back , mid back, left leg)     Pain Score 5    Pain Location Back   Pain Orientation Left;Mid  stiff   Pain Descriptors / Indicators Numbness   Pain Type Chronic pain   Pain Radiating Towards To foot sometimes it lasts all day  several days a week   Aggravating Factors  activity,  carrying case of water.  reaching,     Pain Relieving Factors standing in shower 30 minutes,  frequent rest needed when he uses his arms a lot for work,  exercises with the green band   Effect of Pain on Daily Activities ADL, can't carry casr of water   Pain Score 3   Pain Location Head   Pain Orientation Anterior   Pain Descriptors / Indicators Headache;Stabbing   Pain Frequency Other (Comment)  every other day,     Aggravating Factors  sometimes wakes with,  increase caffene,  worse toward the afternoon.   Pain Relieving Factors sometimes ibuprophen              [facility] PT Assessment - 05/23/16 0001      AROM   Cervical - Right Rotation 48   Cervical - Left Rotation 55                     [facility] Adult PT Treatment/Exercise - 05/23/16 0001      Lumbar Exercises: Supine   Other Supine Lumbar Exercises Decompression 5 minuted. roll for neck, pillow left arm.  shoulder, head, leg press 5 X 5 seconds, leg lengthener.                 PT Education - 05/23/16 0846    Education provided Yes   Education Details HEP   Person(s) Educated Patient   Methods Explanation;Demonstration;Handout   Comprehension Verbalized understanding;Returned demonstration          PT Short Term Goals - 05/23/16 0828      PT SHORT TERM GOAL #1   Title Pt will demo inital HEP for mobility exercises for neck, spine.    Baseline Independent   Time 2   Period Weeks   Status Achieved     PT SHORT TERM GOAL #2   Title Pt will report pain in upper back decreased  30% or more   Baseline 30 % at least   Time 4   Period Weeks     PT SHORT TERM GOAL #3   Title Pt will demo improved Lt knee flexion to 110 degrees and able to lay knee fully extended   Time 4   Period Weeks   Status Achieved     PT SHORT TERM GOAL #4   Title Cervical motion increased 10 degrees or more with flexion and extension   Time 4   Period Weeks   Status Unable to assess           PT Long Term Goals - 05/03/16 0825      PT LONG TERM GOAL #1   Title Patient will ride in a car for 30 minutes without increased pain in order to perfrom daily tasks    Status Achieved     PT LONG TERM GOAL #2   Title Patient will demsotrate full cervical  flexion in order to pick objects off the ground in order to put his shoes on without pain    Baseline end range pain    Status Partially Met     PT LONG TERM GOAL #3   Title He will be able to assume as close to normal posture without increased pain.    Status On-going     PT LONG TERM GOAL #4   Title He will be  able to walk 1 mile for exercises with 1-2 max pain in knee and neck/upper back     PT LONG TERM GOAL #5   Title cervical rotation to 65 degrees or more RT and LT so able to look behind with min to no pain.                Plan - 05/23/16 0901    Clinical Impression Statement STG #1, #2 met.  Pain flared after last session lasting 5 days.  Progress toward HE.  Neck ROM stiff  48 right, 55 left rotation   PT Next Visit Plan scapulo thoracic mobility  flat on back and manual for rhomboid,  review decomression   PT Home Exercise Plan Pirifromis stretch with towel;;  single knee to chest, straight leg raise, hamstring stretching,  ROW red band,  neck retraction,  scapular retraction.  QL flexion.  L1,2 knee.  Decompression   Consulted and Agree with Plan of Care Patient      Patient will benefit from skilled therapeutic intervention in order to improve the following deficits and impairments:  Abnormal gait, Decreased activity tolerance, Decreased strength, Decreased mobility, Impaired flexibility, Increased muscle spasms, Increased fascial restricitons, Difficulty walking, Decreased range of motion, Pain, Postural dysfunction  Visit Diagnosis: Abnormal posture  Stiffness of left shoulder, not elsewhere classified  Stiffness of cervical spine  Pain in thoracic spine  Acute pain of left knee  Muscle spasm of back  Difficulty in walking, not elsewhere classified  Acute left-sided low back pain with left-sided sciatica     Problem List There are no active problems to display for this patient.   [name],[name] PTA 05/23/2016, 9:28 AM  [facility] [place] [address] [place], [place], [address] Phone: [phone]   Fax:  [phone]  Name: Derrick Herman MRN: [id number] Date of Birth: 1980-05-26

## 2016-05-30 ENCOUNTER — Ambulatory Visit: Payer: Self-pay | Attending: Orthopaedic Surgery | Admitting: Physical Therapy

## 2016-05-30 DIAGNOSIS — M25612 Stiffness of left shoulder, not elsewhere classified: Secondary | ICD-10-CM | POA: Insufficient documentation

## 2016-05-30 DIAGNOSIS — M62830 Muscle spasm of back: Secondary | ICD-10-CM | POA: Insufficient documentation

## 2016-05-30 DIAGNOSIS — M546 Pain in thoracic spine: Secondary | ICD-10-CM | POA: Insufficient documentation

## 2016-05-30 DIAGNOSIS — R293 Abnormal posture: Secondary | ICD-10-CM | POA: Insufficient documentation

## 2016-05-30 DIAGNOSIS — R262 Difficulty in walking, not elsewhere classified: Secondary | ICD-10-CM | POA: Insufficient documentation

## 2016-05-30 DIAGNOSIS — M436 Torticollis: Secondary | ICD-10-CM | POA: Insufficient documentation

## 2016-05-30 DIAGNOSIS — M25562 Pain in left knee: Secondary | ICD-10-CM | POA: Insufficient documentation

## 2016-05-30 DIAGNOSIS — M5442 Lumbago with sciatica, left side: Secondary | ICD-10-CM | POA: Insufficient documentation

## 2016-05-30 NOTE — Therapy (Signed)
[place], [place], [address] Phone: [phone]   Fax:  [phone]  Physical Therapy Treatment  Patient Details  Name: Derrick Herman MRN: [id number] Date of Birth: Jul 19, 1980 Referring Provider: Dollene Primrose, MD  Encounter Date: 05/30/2016      PT End of Session - 05/30/16 0903    Visit Number 6   Number of Visits 8   Date for PT Re-Evaluation 06/15/16   Authorization Type 3rd party insurance    PT Start Time [id number]   PT Stop Time 0931   PT Time Calculation (min) 41 min   Activity Tolerance Patient tolerated treatment well   Behavior During Therapy [facility] for tasks assessed/performed      No past medical history on file.  Past Surgical History:  Procedure Laterality Date  . TONSILLECTOMY    . 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      Subjective Assessment - 05/30/16 0855    Subjective Some things are the same.  Pt has good days and bad days.  Sat was bad. Not interested in injections.  Neck and upper back pain. L arm goes numb several times a day.    Currently in Pain? Yes   Pain Score 4    Pain Location Neck  and upper back    Pain Orientation Left;Right  L>R    Pain Type Chronic pain   Pain Onset More than a month ago   Pain Frequency Intermittent   Aggravating Factors  looking up, activity    Pain Relieving Factors heat, stretching, Advil    Effect of Pain on Daily Activities carrying , lifting           [facility] Adult PT Treatment/Exercise - 05/30/16 0909      Shoulder Exercises: Supine   Horizontal ABduction Strengthening;Both;10 reps   Theraband Level (Shoulder Horizontal ABduction) Level 2 (Red)   Flexion Strengthening;Both;10 reps   Theraband Level (Shoulder Flexion) Level 2 (Red)   Other Supine Exercises supine scapular stab progression green band x 10 : overhead , horizontal pull, sash and ER      Shoulder Exercises: Prone   Retraction AAROM;Strengthening;Both;10 reps   Retraction Weight (lbs) retraction and protraction   on elbows for scap and thoracic mobility      Shoulder Exercises: ROM/Strengthening   Other ROM/Strengthening Exercises thoracic ext over chair and with foam pool noodle      Shoulder Exercises: Stretch   Corner Stretch 3 reps;30 seconds     Manual Therapy   Joint Mobilization P/A mobs central and rotational , along thoracic spine , tender and painful                 PT Education - 05/30/16 1246    Education provided Yes   Education Details scapular mobs and spine extension    Person(s) Educated Patient   Methods Explanation;Demonstration;Handout   Comprehension Verbalized understanding;Returned demonstration          PT Short Term Goals - 05/23/16 0828      PT SHORT TERM GOAL #1   Title Pt will demo inital HEP for mobility exercises for neck, spine.    Baseline Independent   Time 2   Period Weeks   Status Achieved     PT SHORT TERM GOAL #2   Title Pt will report pain in upper back decreased 30% or more  Baseline 30 % at least   Time 4   Period Weeks     PT SHORT TERM GOAL #3   Title Pt will demo improved Lt knee flexion to 110 degrees and able to lay knee fully extended   Time 4   Period Weeks   Status Achieved     PT SHORT TERM GOAL #4   Title Cervical motion increased 10 degrees or more with flexion and extension   Time 4   Period Weeks   Status Unable to assess           PT Long Term Goals - 05/30/16 1248      PT LONG TERM GOAL #1   Title Patient will ride in a car for 30 minutes without increased pain in order to perfrom daily tasks    Status Achieved     PT LONG TERM GOAL #2   Title Patient will demsotrate full cervical  flexion in order to pick objects off the ground in order to put his shoes on without pain    Status Partially Met     PT LONG TERM GOAL #3   Title He will be able to assume as close to  normal posture without increased pain.    Status Achieved     PT LONG TERM GOAL #4   Title He will be able to walk 1 mile for exercises with 1-2 max pain in knee and neck/upper back   Status Unable to assess     PT LONG TERM GOAL #5   Title cervical rotation to 65 degrees or more RT and LT so able to look behind with min to no pain.    Status On-going     PT LONG TERM GOAL #6   Title General pain in upper back and neck will become intermittant and max 2-3/10    Status On-going               Plan - 05/30/16 1246    Clinical Impression Statement Patient has not made much progress functionally.  Continues to have days with moderate to severe pain.  Worked on reversing postural imbalances, tight into spinal extension.    PT Next Visit Plan scapulo thoracic mobility  flat on back and manual for rhomboid,  review decomression   PT Home Exercise Plan Pirifromis stretch with towel;;  single knee to chest, straight leg raise, hamstring stretching,  ROW red band,  neck retraction,  scapular retraction.  QL flexion.  L1,2 knee.  Decompression   Consulted and Agree with Plan of Care Patient      Patient will benefit from skilled therapeutic intervention in order to improve the following deficits and impairments:  Abnormal gait, Decreased activity tolerance, Decreased strength, Decreased mobility, Impaired flexibility, Increased muscle spasms, Increased fascial restricitons, Difficulty walking, Decreased range of motion, Pain, Postural dysfunction  Visit Diagnosis: Abnormal posture  Stiffness of left shoulder, not elsewhere classified  Stiffness of cervical spine  Pain in thoracic spine     Problem List There are no active problems to display for this patient.   Derrick Herman 05/30/2016, 12:49 PM  [facility] [address] [place], [place], [address] Phone: [phone]   Fax:  [phone]  Name: Derrick Herman MRN:  [id number] Date of Birth: [birth year]  Raeford Razor, PT 05/30/16 12:54 PM Phone: [phone] Fax: [phone]

## 2016-06-06 ENCOUNTER — Ambulatory Visit: Payer: Self-pay | Admitting: Physical Therapy

## 2016-06-06 DIAGNOSIS — M546 Pain in thoracic spine: Secondary | ICD-10-CM

## 2016-06-06 DIAGNOSIS — M436 Torticollis: Secondary | ICD-10-CM

## 2016-06-06 DIAGNOSIS — M25612 Stiffness of left shoulder, not elsewhere classified: Secondary | ICD-10-CM

## 2016-06-06 DIAGNOSIS — R293 Abnormal posture: Secondary | ICD-10-CM

## 2016-06-06 NOTE — Therapy (Signed)
[place], [place], [address] Phone: [phone]   Fax:  [phone]  Physical Therapy Treatment  Patient Details  Name: Derrick Herman MRN: [id number] Date of Birth: 1980-03-28 Referring Provider: Dollene Primrose, MD  Encounter Date: 06/06/2016      PT End of Session - 06/06/16 1320    Visit Number 7   Number of Visits 8   Date for PT Re-Evaluation 06/15/16   Authorization Type 3rd party insurance    PT Start Time [phone]   PT Stop Time 0935   PT Time Calculation (min) 45 min   Activity Tolerance Patient tolerated treatment well   Behavior During Therapy [facility] for tasks assessed/performed      No past medical history on file.  Past Surgical History:  Procedure Laterality Date  . TONSILLECTOMY    . tumor removal Right    age 40, R knee, [facility]    There were no vitals filed for this visit.      Subjective Assessment - 06/06/16 0856    Subjective No new changes.  Has not got in contact with billing at [facility].  Has not been able to speak to an actual person.  Plans to submit to lawyer and not worry about.     Currently in Pain? Yes   Pain Score 2    Pain Location Back   Pain Orientation Mid   Pain Descriptors / Indicators Pins and needles;Aching  was in calf (pins and needles) but no pain in back    Pain Type Chronic pain   Pain Onset More than a month ago   Pain Frequency Intermittent   Aggravating Factors  sitting in car a long time, looking up    Pain Relieving Factors heat, advil, manual and stretching            [facility] Adult PT Treatment/Exercise - 06/06/16 0906      Knee/Hip Exercises: Aerobic   Nustep L5 UE and LE for 7 min warm up and prep for strength      Shoulder Exercises: Supine   Protraction Strengthening;Both;15 reps   Horizontal ABduction Strengthening;Both;10 reps   Horizontal ABduction Weight (lbs) 7 lbs unilateral    Flexion Strengthening;10 reps    Shoulder Flexion Weight (lbs) 7 lbs lat pull    Other Supine Exercises supine core with 7lbs wgt for above ex      Shoulder Exercises: Prone   Other Prone Exercises prone [place] scapular stab over ball x 10    Other Prone Exercises stretching to upper back between sets with ball      Manual Therapy   Joint Mobilization P/A mobs central and rotational , along thoracic spine , tender and painful    Passive ROM cervical spine                PT Education - 06/06/16 1320    Education provided Yes   Education Details POC, exercises   Person(s) Educated Patient   Methods Explanation   Comprehension Verbalized understanding          PT Short Term Goals - 06/06/16 1327      PT SHORT TERM GOAL #1   Title Pt will demo inital HEP for mobility exercises for neck, spine.    Status Achieved     PT SHORT TERM GOAL #2   Title Pt will report pain in upper back decreased 30% or more   Status Achieved     PT  SHORT TERM GOAL #3   Title Pt will demo improved Lt knee flexion to 110 degrees and able to lay knee fully extended   Status Achieved     PT SHORT TERM GOAL #4   Title Cervical motion increased 10 degrees or more with flexion and extension   Baseline painful ext and end range flexion    Status Achieved     PT SHORT TERM GOAL #5   Title Patient's PSIS level will be equal L V R    Status Deferred           PT Long Term Goals - 06/06/16 1328      PT LONG TERM GOAL #1   Title Patient will ride in a car for 30 minutes without increased pain in order to perfrom daily tasks    Status Achieved     PT LONG TERM GOAL #2   Title Patient will demsotrate full cervical  flexion in order to pick objects off the ground in order to put his shoes on without pain    Status Achieved     PT LONG TERM GOAL #3   Title He will be able to assume as close to normal posture without increased pain.    Status Achieved     PT LONG TERM GOAL #4   Title He will be able to walk 1 mile for  exercises with 1-2 max pain in knee and neck/upper back   Status Unable to assess     PT LONG TERM GOAL #5   Title cervical rotation to 65 degrees or more RT and LT so able to look behind with min to no pain.    Status Unable to assess     PT LONG TERM GOAL #6   Title General pain in upper back and neck will become intermittant and max 2-3/10    Status Partially Met               Plan - 06/06/16 1321    Clinical Impression Statement Patient agreeable to discharge after next visit.  He has been able to go hiking.  Pain increases with prolonged sitting, but he continues to do this. Thoracic and cervical spine very tight.  He felt "better then when he came in".    PT Next Visit Plan scapulo thoracic mobility  flat on back and manual for rhomboid,  review full HEP and DC, foto   PT Home Exercise Plan Pirifromis stretch with towel;;  single knee to chest, straight leg raise, hamstring stretching,  ROW red band,  neck retraction,  scapular retraction.  QL flexion.  L1,2 knee.  Decompression   Consulted and Agree with Plan of Care Patient      Patient will benefit from skilled therapeutic intervention in order to improve the following deficits and impairments:  Abnormal gait, Decreased activity tolerance, Decreased strength, Decreased mobility, Impaired flexibility, Increased muscle spasms, Increased fascial restricitons, Difficulty walking, Decreased range of motion, Pain, Postural dysfunction  Visit Diagnosis: Abnormal posture  Stiffness of left shoulder, not elsewhere classified  Stiffness of cervical spine  Pain in thoracic spine     Problem List There are no active problems to display for this patient.   Derrick Herman 06/06/2016, 1:30 PM  [place], [place], [address] Phone: [phone]   Fax:  [phone]  Name: Derrick Herman MRN: [id number] Date of Birth: 05-Jun-1980   Raeford Razor,  PT 06/06/16 1:30 PM Phone: [phone] Fax: [phone]

## 2016-06-13 ENCOUNTER — Ambulatory Visit: Payer: Self-pay | Admitting: Physical Therapy

## 2016-06-13 ENCOUNTER — Encounter: Payer: Self-pay | Admitting: Physical Therapy

## 2016-06-13 DIAGNOSIS — M5442 Lumbago with sciatica, left side: Secondary | ICD-10-CM

## 2016-06-13 DIAGNOSIS — M25612 Stiffness of left shoulder, not elsewhere classified: Secondary | ICD-10-CM

## 2016-06-13 DIAGNOSIS — R293 Abnormal posture: Secondary | ICD-10-CM

## 2016-06-13 DIAGNOSIS — M546 Pain in thoracic spine: Secondary | ICD-10-CM

## 2016-06-13 DIAGNOSIS — R262 Difficulty in walking, not elsewhere classified: Secondary | ICD-10-CM

## 2016-06-13 DIAGNOSIS — M25562 Pain in left knee: Secondary | ICD-10-CM

## 2016-06-13 DIAGNOSIS — M62830 Muscle spasm of back: Secondary | ICD-10-CM

## 2016-06-13 DIAGNOSIS — M436 Torticollis: Secondary | ICD-10-CM

## 2016-06-13 NOTE — Patient Instructions (Signed)
Continue to exercise upon discharge.

## 2016-06-13 NOTE — Therapy (Addendum)
[place], [place], [address] Phone: [phone]   Fax:  [phone]  Physical Therapy Treatment and Discharge  Patient Details  Name: Derrick Herman MRN: [id number] Date of Birth: [birth year] Referring Provider: Dollene Primrose, MD  Encounter Date: 06/13/2016      PT End of Session - 06/13/16 1036    Visit Number 8   Number of Visits 8   Date for PT Re-Evaluation 06/15/16   PT Start Time 0850   PT Stop Time 0934   PT Time Calculation (min) 44 min   Activity Tolerance Patient tolerated treatment well   Behavior During Therapy [facility] for tasks assessed/performed      History reviewed. No pertinent past medical history.  Past Surgical History:  Procedure Laterality Date  . TONSILLECTOMY    . 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      Subjective Assessment - 06/13/16 0855    Subjective Pain 2-5/ 10 after walking .5 to 1 mile   Currently in Pain? Yes   Pain Score 5    Pain Location Back   Pain Orientation Mid   Pain Descriptors / Indicators Tingling;Numbness   Pain Type Chronic pain   Pain Radiating Towards last 2 fingers   Pain Frequency Intermittent   Aggravating Factors  1 st thing in the morning,  lasts 1 hour   Pain Relieving Factors heat manual, manual stretching    Effect of Pain on Daily Activities carrying lifting   Pain Score --  up to severe   Pain Location Head   Pain Orientation Anterior   Pain Descriptors / Indicators Headache;Stabbing  Ice pick through eye,  allergies   Pain Radiating Towards eye   Pain Frequency Several days a week  5 X a wek, almost everyday   Aggravating Factors  not sure   Pain Relieving Factors ice pack and ibuprophen            [facility] PT Assessment - 06/13/16 0001      Observation/Other Assessments   Focus on Therapeutic Outcomes (FOTO)  67% ability  exceeded prediction of 65  %                     [facility] Adult PT Treatment/Exercise - 06/13/16 0001      Neck Exercises: Supine   Neck Retraction 10 reps   Neck Retraction Limitations he does 2 x a day   Other Supine Exercise supine scapular stabilization series,  p     Knee/Hip Exercises: Stretches   Active Hamstring Stretch Limitations reviewed verbally, he does standing.    Piriformis Stretch 3 reps;30 seconds   Piriformis Stretch Limitations towel,  left  prickley pain in calf intermittant,  left.   Other Knee/Hip Stretches QL stretches increased hand pain and tingling.      Knee/Hip Exercises: Aerobic   Nustep 7 minutes L5, arms/legs     Knee/Hip Exercises: Supine   Quad Sets 10 reps   Quad Sets Limitations good quad contraction    Straight Leg Raises 1 set;10 reps  guarded, not easy     Shoulder Exercises: Supine   Other Supine Exercises supine scapular stab progression blue band x 10 : overhead , horizontal pull, sash and ER  10 x each     Shoulder Exercises: ROM/Strengthening   Cybex Row --  35 LBS 10 x 2  sets   Cybex Row Limitations minor pain                  PT Short Term Goals - 06/06/16 1327      PT SHORT TERM GOAL #1   Title Pt will demo inital HEP for mobility exercises for neck, spine.    Status Achieved     PT SHORT TERM GOAL #2   Title Pt will report pain in upper back decreased 30% or more   Status Achieved     PT SHORT TERM GOAL #3   Title Pt will demo improved Lt knee flexion to 110 degrees and able to lay knee fully extended   Status Achieved     PT SHORT TERM GOAL #4   Title Cervical motion increased 10 degrees or more with flexion and extension   Baseline painful ext and end range flexion    Status Achieved     PT SHORT TERM GOAL #5   Title Patient's PSIS level will be equal L V R    Status Deferred           PT Long Term Goals - 06/13/16 1039      PT LONG TERM GOAL #1   Title Patient will ride in a car for 30 minutes without  increased pain in order to perfrom daily tasks    Time 8   Period Weeks   Status Achieved     PT LONG TERM GOAL #2   Title Patient will demsotrate full cervical  flexion in order to pick objects off the ground in order to put his shoes on without pain    Time 8   Period Weeks   Status Achieved     PT LONG TERM GOAL #3   Title He will be able to assume as close to normal posture without increased pain.    Time 8   Period Weeks   Status Achieved     PT LONG TERM GOAL #4   Title He will be able to walk 1 mile for exercises with 1-2 max pain in knee and neck/upper back   Baseline 1/2 to 1 mile with pain 5-6/ 10    Time 8   Period Weeks   Status Partially Met     PT LONG TERM GOAL #5   Title cervical rotation to 65 degrees or more RT and LT so able to look behind with min to no pain.    Baseline 58   Time 8   Period Weeks   Status Partially Met     PT LONG TERM GOAL #6   Title General pain in upper back and neck will become intermittant and max 2-3/10    Baseline 5-6/10   Time 8   Period Weeks   Status Partially Met               Plan - 06/13/16 1036    Clinical Impression Statement FOTO 67% ability.  He is able to walk 1 mile at times with pain.  Cervical rotation 58 degrees RT/LT.  Independent with his HEP.  LTG#4  partially met,  LTG#5 partially  met LTG#6 partially met.  all other goals met.    PT Next Visit Plan D/C   PT Home Exercise Plan Pirifromis stretch with towel;;  single knee to chest, straight leg raise, hamstring stretching,  ROW red band,  neck retraction,  scapular retraction.  QL flexion.  L1,2 knee.  Decompression   Consulted  and Agree with Plan of Care Patient      Patient will benefit from skilled therapeutic intervention in order to improve the following deficits and impairments:  Abnormal gait, Decreased activity tolerance, Decreased strength, Decreased mobility, Impaired flexibility, Increased muscle spasms, Increased fascial restricitons,  Difficulty walking, Decreased range of motion, Pain, Postural dysfunction  Visit Diagnosis: Abnormal posture  Stiffness of left shoulder, not elsewhere classified  Stiffness of cervical spine  Pain in thoracic spine  Acute pain of left knee  Muscle spasm of back  Difficulty in walking, not elsewhere classified  Acute left-sided low back pain with left-sided sciatica     Problem List There are no active problems to display for this patient.   [name],[name] PTA 06/13/2016, 10:42 AM  [facility] [address] [place], [place], [address] Phone: [phone]   Fax:  [phone]  Name: Derrick Herman MRN: [id number] Date of Birth: August 25, 1980   PHYSICAL THERAPY DISCHARGE SUMMARY  Visits from Start of Care: 8  Current functional level related to goals / functional outcomes: See above    Remaining deficits: Activity tolerance, cervical AROM, core strength and posture    Education / Equipment: HEP, RICE , trigger points, posture  Plan: Patient agrees to discharge.  Patient goals were partially met. Patient is being discharged due to lack of progress.  ?????    Raeford Razor, PT 06/14/16 7:59 AM Phone: [phone] Fax: [phone]

## 2016-06-27 ENCOUNTER — Ambulatory Visit (INDEPENDENT_AMBULATORY_CARE_PROVIDER_SITE_OTHER): Payer: Self-pay | Admitting: Orthopaedic Surgery

## 2016-06-27 ENCOUNTER — Encounter (INDEPENDENT_AMBULATORY_CARE_PROVIDER_SITE_OTHER): Payer: Self-pay | Admitting: Orthopaedic Surgery

## 2016-06-27 DIAGNOSIS — M545 Low back pain, unspecified: Secondary | ICD-10-CM

## 2016-06-27 NOTE — Progress Notes (Signed)
   Office Visit Note   Patient: Derrick SectionKenneth Herman           Date of Birth: 12/30/1980           MRN: [id number] Visit Date: 06/27/2016              Requested by: No referring provider defined for this encounter. PCP: Patient, No Pcp Per   Assessment & Plan: Visit Diagnoses:  1. Acute midline low back pain without sciatica     Plan: Patient is doing well from a back standpoint. He's doing home exercises. He has no symptoms. Follow-up with me as needed.  Follow-Up Instructions: Return if symptoms worsen or fail to improve.   Orders:  No orders of the defined types were placed in this encounter.  No orders of the defined types were placed in this encounter.     Procedures: No procedures performed   Clinical Data: No additional findings.   Subjective: Chief Complaint  Patient presents with  . Lower Back - Pain    [name] comes back today for his back pain. He is doing well. He's been discharged from physical therapy. He is able to go on hikes without any pain.    Review of Systems   Objective: Vital Signs: There were no vitals taken for this visit.  Physical Exam  Ortho Exam Low back exam is benign. Specialty Comments:  No specialty comments available.  Imaging: No results found.   PMFS History: There are no active problems to display for this patient.  No past medical history on file.  No family history on file.  Past Surgical History:  Procedure Laterality Date  . TONSILLECTOMY    . tumor removal Right    age 36, R knee, [facility]   Social History   Occupational History  . Not on file.   Social History Main Topics  . Smoking status: Current Every Day Smoker  . Smokeless tobacco: Never Used  . Alcohol use No  . Drug use: No  . Sexual activity: Not on file

## 2016-08-23 ENCOUNTER — Telehealth (INDEPENDENT_AMBULATORY_CARE_PROVIDER_SITE_OTHER): Payer: Self-pay | Admitting: Orthopaedic Surgery

## 2016-08-23 NOTE — Telephone Encounter (Signed)
Records&bills faxed at[facility] [place]
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
